# Patient Record
Sex: Male | Born: 1955 | Hispanic: Yes | Marital: Married | Smoking: Former smoker
Health system: Southern US, Community
[De-identification: ages and names within clinical notes are randomized; demographics above are authoritative.]

## PROBLEM LIST (undated history)

## (undated) DIAGNOSIS — Z860100 Personal history of colon polyps, unspecified: Secondary | ICD-10-CM

## (undated) DIAGNOSIS — C801 Malignant (primary) neoplasm, unspecified: Secondary | ICD-10-CM

## (undated) DIAGNOSIS — Z800 Family history of malignant neoplasm of digestive organs: Secondary | ICD-10-CM

## (undated) DIAGNOSIS — Z8051 Family history of malignant neoplasm of kidney: Secondary | ICD-10-CM

## (undated) DIAGNOSIS — Z803 Family history of malignant neoplasm of breast: Secondary | ICD-10-CM

## (undated) DIAGNOSIS — J45909 Unspecified asthma, uncomplicated: Secondary | ICD-10-CM

## (undated) DIAGNOSIS — Z808 Family history of malignant neoplasm of other organs or systems: Secondary | ICD-10-CM

## (undated) DIAGNOSIS — I10 Essential (primary) hypertension: Secondary | ICD-10-CM

## (undated) DIAGNOSIS — M1990 Unspecified osteoarthritis, unspecified site: Secondary | ICD-10-CM

## (undated) HISTORY — DX: Family history of malignant neoplasm of breast: Z80.3

## (undated) HISTORY — DX: Unspecified asthma, uncomplicated: J45.909

## (undated) HISTORY — PX: NECK SURGERY: SHX720

## (undated) HISTORY — DX: Personal history of colonic polyps: Z86.010

## (undated) HISTORY — DX: Family history of malignant neoplasm of kidney: Z80.51

## (undated) HISTORY — DX: Essential (primary) hypertension: I10

## (undated) HISTORY — DX: Personal history of colon polyps, unspecified: Z86.0100

## (undated) HISTORY — DX: Family history of malignant neoplasm of digestive organs: Z80.0

## (undated) HISTORY — PX: CHOLECYSTECTOMY: SHX55

## (undated) HISTORY — PX: LAMINECTOMY: SHX219

## (undated) HISTORY — DX: Family history of malignant neoplasm of other organs or systems: Z80.8

## (undated) HISTORY — PX: BACK SURGERY: SHX140

## (undated) HISTORY — DX: Unspecified osteoarthritis, unspecified site: M19.90

---
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
## 2003-02-26 DIAGNOSIS — I10 Essential (primary) hypertension: Secondary | ICD-10-CM

## 2003-02-26 HISTORY — PX: KNEE SURGERY: SHX244

## 2003-02-26 HISTORY — DX: Essential (primary) hypertension: I10

## 2004-02-26 HISTORY — PX: COLONOSCOPY: SHX174

## 2004-05-08 ENCOUNTER — Ambulatory Visit: Payer: Self-pay | Admitting: Internal Medicine

## 2004-05-25 ENCOUNTER — Ambulatory Visit: Payer: Self-pay | Admitting: Internal Medicine

## 2004-06-11 ENCOUNTER — Ambulatory Visit: Payer: Self-pay | Admitting: Internal Medicine

## 2006-08-11 ENCOUNTER — Ambulatory Visit: Payer: Self-pay

## 2007-04-14 ENCOUNTER — Emergency Department: Payer: Self-pay | Admitting: Emergency Medicine

## 2009-02-25 DIAGNOSIS — M1990 Unspecified osteoarthritis, unspecified site: Secondary | ICD-10-CM

## 2009-02-25 HISTORY — DX: Unspecified osteoarthritis, unspecified site: M19.90

## 2010-12-24 ENCOUNTER — Ambulatory Visit: Payer: Self-pay | Admitting: Internal Medicine

## 2011-10-22 ENCOUNTER — Ambulatory Visit: Payer: Self-pay | Admitting: General Surgery

## 2012-07-18 ENCOUNTER — Emergency Department: Payer: Self-pay | Admitting: Emergency Medicine

## 2012-09-10 ENCOUNTER — Encounter: Payer: Self-pay | Admitting: *Deleted

## 2013-02-10 ENCOUNTER — Emergency Department (HOSPITAL_COMMUNITY)
Admission: EM | Admit: 2013-02-10 | Discharge: 2013-02-10 | Disposition: A | Discharge status: Home / Self Care | Payer: 59 | Attending: Emergency Medicine | Admitting: Emergency Medicine

## 2013-02-10 ENCOUNTER — Encounter (HOSPITAL_COMMUNITY): Payer: Self-pay | Admitting: Emergency Medicine

## 2013-02-10 DIAGNOSIS — R220 Localized swelling, mass and lump, head: Secondary | ICD-10-CM | POA: Insufficient documentation

## 2013-02-10 DIAGNOSIS — I10 Essential (primary) hypertension: Secondary | ICD-10-CM | POA: Insufficient documentation

## 2013-02-10 DIAGNOSIS — J45909 Unspecified asthma, uncomplicated: Secondary | ICD-10-CM | POA: Insufficient documentation

## 2013-02-10 NOTE — ED Notes (Signed)
Still unable to locate pt to update vitals.

## 2013-02-10 NOTE — ED Notes (Signed)
Pt c/o mass to right side of neck that is bigger and more tender over several months; pt sts some issues swallowing at times and so decrease in hearing to right ear

## 2013-02-10 NOTE — ED Notes (Signed)
No answer in waiting room 

## 2013-02-10 NOTE — ED Notes (Signed)
After announcement made and pt updated about wait he got up and walked to restroom.  Unable to locate pt since then.  Pt is no longer in restroom and not answering when called in waiting area.

## 2013-03-03 ENCOUNTER — Ambulatory Visit: Payer: Self-pay | Admitting: Internal Medicine

## 2013-03-04 ENCOUNTER — Other Ambulatory Visit (HOSPITAL_COMMUNITY): Payer: Self-pay | Admitting: Otolaryngology

## 2013-03-04 DIAGNOSIS — R221 Localized swelling, mass and lump, neck: Secondary | ICD-10-CM

## 2013-03-08 ENCOUNTER — Encounter (HOSPITAL_COMMUNITY): Payer: Self-pay

## 2013-03-08 ENCOUNTER — Ambulatory Visit (HOSPITAL_COMMUNITY)
Admission: RE | Admit: 2013-03-08 | Discharge: 2013-03-08 | Disposition: A | Discharge status: Home / Self Care | Payer: 59 | Source: Ambulatory Visit | Attending: Otolaryngology | Admitting: Otolaryngology

## 2013-03-08 DIAGNOSIS — E041 Nontoxic single thyroid nodule: Secondary | ICD-10-CM | POA: Insufficient documentation

## 2013-03-08 DIAGNOSIS — R220 Localized swelling, mass and lump, head: Secondary | ICD-10-CM | POA: Insufficient documentation

## 2013-03-08 DIAGNOSIS — M47812 Spondylosis without myelopathy or radiculopathy, cervical region: Secondary | ICD-10-CM | POA: Insufficient documentation

## 2013-03-08 DIAGNOSIS — R221 Localized swelling, mass and lump, neck: Secondary | ICD-10-CM

## 2013-03-08 MED ORDER — IOHEXOL 300 MG/ML  SOLN
75.0000 mL | Freq: Once | INTRAMUSCULAR | Status: AC | PRN
  Administered 2013-03-08: 75 mL via INTRAVENOUS

## 2013-03-10 ENCOUNTER — Other Ambulatory Visit (HOSPITAL_COMMUNITY): Payer: Self-pay | Admitting: Otolaryngology

## 2013-03-10 DIAGNOSIS — R221 Localized swelling, mass and lump, neck: Secondary | ICD-10-CM

## 2013-03-11 ENCOUNTER — Encounter (HOSPITAL_COMMUNITY): Payer: Self-pay | Admitting: Pharmacy Technician

## 2013-03-11 ENCOUNTER — Other Ambulatory Visit: Payer: Self-pay | Admitting: Radiology

## 2013-03-12 ENCOUNTER — Encounter (HOSPITAL_COMMUNITY): Payer: Self-pay

## 2013-03-12 ENCOUNTER — Ambulatory Visit (HOSPITAL_COMMUNITY)
Admission: RE | Admit: 2013-03-12 | Discharge: 2013-03-12 | Disposition: A | Discharge status: Home / Self Care | Payer: 59 | Source: Ambulatory Visit | Attending: Otolaryngology | Admitting: Otolaryngology

## 2013-03-12 DIAGNOSIS — C770 Secondary and unspecified malignant neoplasm of lymph nodes of head, face and neck: Secondary | ICD-10-CM | POA: Insufficient documentation

## 2013-03-12 DIAGNOSIS — R220 Localized swelling, mass and lump, head: Secondary | ICD-10-CM | POA: Insufficient documentation

## 2013-03-12 DIAGNOSIS — R221 Localized swelling, mass and lump, neck: Secondary | ICD-10-CM

## 2013-03-12 DIAGNOSIS — I10 Essential (primary) hypertension: Secondary | ICD-10-CM | POA: Insufficient documentation

## 2013-03-12 DIAGNOSIS — C801 Malignant (primary) neoplasm, unspecified: Secondary | ICD-10-CM | POA: Insufficient documentation

## 2013-03-12 DIAGNOSIS — Z87891 Personal history of nicotine dependence: Secondary | ICD-10-CM | POA: Insufficient documentation

## 2013-03-12 LAB — PROTIME-INR
INR: 0.85 (ref 0.00–1.49)
PROTHROMBIN TIME: 11.5 s — AB (ref 11.6–15.2)

## 2013-03-12 LAB — CBC
HCT: 44.9 % (ref 39.0–52.0)
Hemoglobin: 16.1 g/dL (ref 13.0–17.0)
MCH: 34.5 pg — AB (ref 26.0–34.0)
MCHC: 35.9 g/dL (ref 30.0–36.0)
MCV: 96.1 fL (ref 78.0–100.0)
PLATELETS: 217 10*3/uL (ref 150–400)
RBC: 4.67 MIL/uL (ref 4.22–5.81)
RDW: 12.1 % (ref 11.5–15.5)
WBC: 4.9 10*3/uL (ref 4.0–10.5)

## 2013-03-12 LAB — APTT: aPTT: 32 seconds (ref 24–37)

## 2013-03-12 MED ORDER — MIDAZOLAM HCL 2 MG/2ML IJ SOLN
INTRAMUSCULAR | Status: AC
  Filled 2013-03-12: qty 4

## 2013-03-12 MED ORDER — SODIUM CHLORIDE 0.9 % IV SOLN: INTRAVENOUS | Status: DC

## 2013-03-12 MED ORDER — MIDAZOLAM HCL 2 MG/2ML IJ SOLN
INTRAMUSCULAR | Status: AC | PRN
  Administered 2013-03-12 (×2): 0.5 mg via INTRAVENOUS
  Administered 2013-03-12: 1 mg via INTRAVENOUS
  Administered 2013-03-12: 0.5 mg via INTRAVENOUS

## 2013-03-12 MED ORDER — FENTANYL CITRATE 0.05 MG/ML IJ SOLN
INTRAMUSCULAR | Status: AC
  Filled 2013-03-12: qty 2

## 2013-03-12 MED ORDER — FENTANYL CITRATE 0.05 MG/ML IJ SOLN
INTRAMUSCULAR | Status: AC | PRN
Start: 2013-03-12 — End: 2013-03-12
  Administered 2013-03-12 (×2): 25 ug via INTRAVENOUS

## 2013-03-12 NOTE — H&P (Signed)
Lance Parsons is an 58 y.o. male.   Chief Complaint: pt noticed swelling "knot" in Rt neck while shaving 11/2012 Knot has increased in size Swelling in area also noted Referred to ENT Dr Simeon Craft US reveals Rt cervical mass Request for biopsy of same  HPI: arthritis; HTN; asthma  Past Medical History  Diagnosis Date  . Arthritis 2011  . Asthma 1967  . Hypertension 2005  . Personal history of colonic polyps     Past Surgical History  Procedure Laterality Date  . Knee surgery Left 2005  . Colonoscopy  2006  . Cholecystectomy      Family History  Problem Relation Age of Onset  . Colon polyps Brother   . Breast cancer Maternal Aunt   . Throat cancer Maternal Uncle   . Colon cancer Father    Social History:  reports that he has quit smoking. He has never used smokeless tobacco. He reports that he drinks alcohol. He reports that he uses illicit drugs (Marijuana).  Allergies:  Allergies  Allergen Reactions  . Codeine Swelling and Rash     (Not in a hospital admission)  Results for orders placed during the hospital encounter of 03/12/13 (from the past 48 hour(s))  APTT     Status: None   Collection Time    03/12/13  9:04 AM      Result Value Range   aPTT 32  24 - 37 seconds  CBC     Status: Abnormal   Collection Time    03/12/13  9:04 AM      Result Value Range   WBC 4.9  4.0 - 10.5 K/uL   RBC 4.67  4.22 - 5.81 MIL/uL   Hemoglobin 16.1  13.0 - 17.0 g/dL   HCT 44.9  39.0 - 52.0 %   MCV 96.1  78.0 - 100.0 fL   MCH 34.5 (*) 26.0 - 34.0 pg   MCHC 35.9  30.0 - 36.0 g/dL   RDW 12.1  11.5 - 15.5 %   Platelets 217  150 - 400 K/uL  PROTIME-INR     Status: Abnormal   Collection Time    03/12/13  9:04 AM      Result Value Range   Prothrombin Time 11.5 (*) 11.6 - 15.2 seconds   INR 0.85  0.00 - 1.49   No results found.  Review of Systems  Constitutional: Negative for fever and weight loss.  Eyes: Negative for blurred vision.  Respiratory: Negative for shortness  of breath.   Cardiovascular: Negative for chest pain.  Gastrointestinal: Negative for nausea, vomiting and abdominal pain.  Musculoskeletal: Positive for neck pain.  Neurological: Negative for dizziness, weakness and headaches.  Psychiatric/Behavioral: Negative for substance abuse.    Blood pressure 141/93, pulse 77, temperature 98 F (36.7 C), temperature source Oral, resp. rate 18, height 5\' 7"  (1.702 m), weight 178 lb (80.74 kg), SpO2 98.00%. Physical Exam  Constitutional: He is oriented to person, place, and time. He appears well-developed and well-nourished.  Cardiovascular: Normal rate, regular rhythm and normal heart sounds.   No murmur heard. Respiratory: Effort normal and breath sounds normal. He has no wheezes.  GI: Soft. Bowel sounds are normal. There is no tenderness.  Musculoskeletal: Normal range of motion.  Neurological: He is alert and oriented to person, place, and time.  Skin: Skin is warm and dry.  Psychiatric: He has a normal mood and affect. His behavior is normal. Judgment and thought content normal.     Assessment/Plan Rt  cervical neck mass Increasing in size since 11/2012 Now scheduled of bx Pt aware of procedure benefits and risks and agreeable to proceed Consent signed and in chart  Mikalia Fessel A 03/12/2013, 9:46 AM

## 2013-03-12 NOTE — Procedures (Signed)
US guided core biopsies of right neck mass/node.  No immediate complication.  4 cores obtained.

## 2013-03-12 NOTE — Discharge Instructions (Signed)
Needle Biopsy °Care After °These instructions give you information on caring for yourself after your procedure. Your doctor may also give you more specific instructions. Call your doctor if you have any problems or questions after your procedure. °HOME CARE °· Rest for 4 hours after your biopsy, except for getting up to go to the bathroom or as told. °· Keep the places where the needles were put in clean and dry. °· Do not put powder or lotion on the sites. °· Do not shower until 24 hours after the test. Remove all bandages (dressings) before showering. °· Remove all bandages at least once every day. Gently clean the sites with soap and water. Keep putting a new bandage on until the skin is closed. °Finding out the results of your test °Ask your doctor when your test results will be ready. Make sure you follow up and get the test results. °GET HELP RIGHT AWAY IF:  °· You have shortness of breath or trouble breathing. °· You have pain or cramping in your belly (abdomen). °· You feel sick to your stomach (nauseous) or throw up (vomit). °· Any of the places where the needles were put in: °· Are puffy (swollen) or red. °· Are sore or hot to the touch. °· Are draining yellowish-white fluid (pus). °· Are bleeding after 10 minutes of pressing down on the site. Have someone keep pressing on any place that is bleeding until you see a doctor. °· You have any unusual pain that will not stop. °· You have a fever. °If you go to the emergency room, tell the nurse that you had a biopsy. Take this paper with you to show the nurse. °MAKE SURE YOU:  °· Understand these instructions. °· Will watch your condition. °· Will get help right away if you are not doing well or get worse. °Document Released: 01/25/2008 Document Revised: 05/06/2011 Document Reviewed: 01/25/2008 °[employer]® Patient Information ©2014 [employer]. ° ° °

## 2013-04-26 DIAGNOSIS — C801 Malignant (primary) neoplasm, unspecified: Secondary | ICD-10-CM

## 2013-04-26 HISTORY — DX: Malignant (primary) neoplasm, unspecified: C80.1

## 2013-04-26 HISTORY — PX: TONSILLECTOMY: SUR1361

## 2013-05-24 ENCOUNTER — Emergency Department (HOSPITAL_COMMUNITY): Payer: 59

## 2013-05-24 ENCOUNTER — Encounter (HOSPITAL_COMMUNITY): Payer: Self-pay | Admitting: Emergency Medicine

## 2013-05-24 ENCOUNTER — Emergency Department (HOSPITAL_COMMUNITY)
Admission: EM | Admit: 2013-05-24 | Discharge: 2013-05-24 | Disposition: A | Discharge status: Home / Self Care | Payer: 59 | Attending: Emergency Medicine | Admitting: Emergency Medicine

## 2013-05-24 DIAGNOSIS — R109 Unspecified abdominal pain: Secondary | ICD-10-CM

## 2013-05-24 DIAGNOSIS — Z87891 Personal history of nicotine dependence: Secondary | ICD-10-CM | POA: Insufficient documentation

## 2013-05-24 DIAGNOSIS — M129 Arthropathy, unspecified: Secondary | ICD-10-CM | POA: Insufficient documentation

## 2013-05-24 DIAGNOSIS — R1031 Right lower quadrant pain: Secondary | ICD-10-CM | POA: Insufficient documentation

## 2013-05-24 DIAGNOSIS — J45909 Unspecified asthma, uncomplicated: Secondary | ICD-10-CM | POA: Insufficient documentation

## 2013-05-24 DIAGNOSIS — Z860100 Personal history of colon polyps, unspecified: Secondary | ICD-10-CM | POA: Insufficient documentation

## 2013-05-24 DIAGNOSIS — I10 Essential (primary) hypertension: Secondary | ICD-10-CM | POA: Insufficient documentation

## 2013-05-24 DIAGNOSIS — Z79899 Other long term (current) drug therapy: Secondary | ICD-10-CM | POA: Insufficient documentation

## 2013-05-24 DIAGNOSIS — Z9089 Acquired absence of other organs: Secondary | ICD-10-CM | POA: Insufficient documentation

## 2013-05-24 LAB — COMPREHENSIVE METABOLIC PANEL
ALK PHOS: 62 U/L (ref 39–117)
ALT: 15 U/L (ref 0–53)
AST: 14 U/L (ref 0–37)
Albumin: 3.2 g/dL — ABNORMAL LOW (ref 3.5–5.2)
BUN: 6 mg/dL (ref 6–23)
CALCIUM: 8.6 mg/dL (ref 8.4–10.5)
CO2: 24 mEq/L (ref 19–32)
Chloride: 106 mEq/L (ref 96–112)
Creatinine, Ser: 0.81 mg/dL (ref 0.50–1.35)
GFR calc Af Amer: >90 mL/min (ref >90)
GFR calc non Af Amer: >90 mL/min (ref >90)
GLUCOSE: 76 mg/dL (ref 70–99)
POTASSIUM: 3.8 meq/L (ref 3.7–5.3)
SODIUM: 143 meq/L (ref 137–147)
TOTAL PROTEIN: 6.4 g/dL (ref 6.0–8.3)
Total Bilirubin: 0.2 mg/dL — ABNORMAL LOW (ref 0.3–1.2)

## 2013-05-24 LAB — CBC WITH DIFFERENTIAL/PLATELET
BASOS ABS: 0 10*3/uL (ref 0.0–0.1)
Basophils Relative: 0 % (ref 0–1)
EOS PCT: 2 % (ref 0–5)
Eosinophils Absolute: 0.2 10*3/uL (ref 0.0–0.7)
HCT: 37.9 % — ABNORMAL LOW (ref 39.0–52.0)
Hemoglobin: 13.4 g/dL (ref 13.0–17.0)
LYMPHS ABS: 1.7 10*3/uL (ref 0.7–4.0)
Lymphocytes Relative: 27 % (ref 12–46)
MCH: 33.6 pg (ref 26.0–34.0)
MCHC: 35.4 g/dL (ref 30.0–36.0)
MCV: 95 fL (ref 78.0–100.0)
Monocytes Absolute: 0.7 10*3/uL (ref 0.1–1.0)
Monocytes Relative: 11 % (ref 3–12)
NEUTROS PCT: 60 % (ref 43–77)
Neutro Abs: 3.7 10*3/uL (ref 1.7–7.7)
PLATELETS: 194 10*3/uL (ref 150–400)
RBC: 3.99 MIL/uL — AB (ref 4.22–5.81)
RDW: 12.2 % (ref 11.5–15.5)
WBC: 6.2 10*3/uL (ref 4.0–10.5)

## 2013-05-24 LAB — URINALYSIS, ROUTINE W REFLEX MICROSCOPIC
Bilirubin Urine: NEGATIVE
Glucose, UA: NEGATIVE mg/dL
HGB URINE DIPSTICK: NEGATIVE
Ketones, ur: NEGATIVE mg/dL
Leukocytes, UA: NEGATIVE
Nitrite: NEGATIVE
Protein, ur: NEGATIVE mg/dL
SPECIFIC GRAVITY, URINE: 1.007 (ref 1.005–1.030)
UROBILINOGEN UA: 0.2 mg/dL (ref 0.0–1.0)
pH: 6.5 (ref 5.0–8.0)

## 2013-05-24 LAB — LIPASE, BLOOD: Lipase: 17 U/L (ref 11–59)

## 2013-05-24 MED ORDER — SODIUM CHLORIDE 0.9 % IV BOLUS (SEPSIS)
1000.0000 mL | Freq: Once | INTRAVENOUS | Status: AC
  Administered 2013-05-24: 1000 mL via INTRAVENOUS

## 2013-05-24 MED ORDER — HYDROMORPHONE HCL PF 1 MG/ML IJ SOLN
0.5000 mg | Freq: Once | INTRAMUSCULAR | Status: AC
Start: 2013-05-24 — End: 2013-05-24
  Administered 2013-05-24: 0.5 mg via INTRAVENOUS
  Filled 2013-05-24: qty 1

## 2013-05-24 MED ORDER — HYDROMORPHONE HCL PF 1 MG/ML IJ SOLN
1.0000 mg | Freq: Once | INTRAMUSCULAR | Status: DC
Start: 2013-05-24 — End: 2013-05-24

## 2013-05-24 MED ORDER — IOHEXOL 300 MG/ML  SOLN
25.0000 mL | INTRAMUSCULAR | Status: AC
  Administered 2013-05-24: 25 mL via ORAL

## 2013-05-24 MED ORDER — IOHEXOL 300 MG/ML  SOLN
80.0000 mL | Freq: Once | INTRAMUSCULAR | Status: AC | PRN
  Administered 2013-05-24: 80 mL via INTRAVENOUS

## 2013-05-24 NOTE — ED Notes (Signed)
Pt states that he started having RLQ pain on Thursday, flatus, constipation, LBM 2 days ago.  Pt has completed CT Scan.  Continuing IVF bolus as ordered

## 2013-05-24 NOTE — ED Notes (Signed)
Pt reports that he developed rlq pain on Friday. States that he recently had major cancer surgery on March 2nd to remove lymph nodes in his neck and has been taking pain medication since then. States that he has had trouble with bowel movements for the past couple of days. Denies any n/v, but reports constipation.

## 2013-05-24 NOTE — ED Notes (Signed)
Provider at the bedside.  

## 2013-05-24 NOTE — ED Provider Notes (Signed)
CSN: [id number]     Arrival date & time 05/24/13  0729 History   First MD Initiated Contact with Patient 05/24/13 0730     Chief Complaint  Patient presents with  . Abdominal Pain     (Consider location/radiation/quality/duration/timing/severity/associated sxs/prior Treatment) The history is provided by the patient. No language interpreter was used.  Lance Parsons is a 58 y/o M with PMHX of arthritis, HTN, asthma, colonic polyps, recent surgery on 04/26/2013 regarding removal of tonsils, 60 lymph nodes, and adenoids as per patient's report by Dr. Nanine Means at [facility] regarding possible occult tumor that was not found - suspicion to be cancer - presenting to the ED with abdominal pain. Reported that the abdominal pain started on Friday evening localized to the RLQ described at first as a dull aching sensation that has gotten progressively worse throughout the weekend and day. Patient reported that the pain is a sharp shooting pain with coughing and motion. Patient reported that he has not had a BM within 3 days. Reported that he is on Oxycodone for the discomfort of the neck surgery and reported that he is currently taking a stool softener, Senna. Reported that he has been passing gas. Denied nausea, vomiting, diarrhea, chest pain, shortness of breath, difficulty breathing, dizziness, weakness, melena, hematochezia, penile swelling, testicular swelling, testicular pain. PCP Dr. Hall Busing  Past Medical History  Diagnosis Date  . Arthritis 2011  . Asthma 1967  . Hypertension 2005  . Personal history of colonic polyps    Past Surgical History  Procedure Laterality Date  . Knee surgery Left 2005  . Colonoscopy  2006  . Cholecystectomy     Family History  Problem Relation Age of Onset  . Colon polyps Brother   . Breast cancer Maternal Aunt   . Throat cancer Maternal Uncle   . Colon cancer Father    History  Substance Use Topics  . Smoking status: Former Smoker -- 1.00 packs/day for 7 years   . Smokeless tobacco: Never Used  . Alcohol Use: Yes    Review of Systems  Constitutional: Negative for fever and chills.  Respiratory: Negative for chest tightness and shortness of breath.   Cardiovascular: Negative for chest pain.  Gastrointestinal: Positive for abdominal pain. Negative for nausea, vomiting, diarrhea, constipation, blood in stool and anal bleeding.  Musculoskeletal: Negative for back pain.  Neurological: Negative for dizziness and weakness.  All other systems reviewed and are negative.      Allergies  Codeine  Home Medications   Current Outpatient Rx  Name  Route  Sig  Dispense  Refill  . albuterol (PROVENTIL HFA;VENTOLIN HFA) 108 (90 BASE) MCG/ACT inhaler   Inhalation   Inhale 1 puff into the lungs every 6 (six) hours as needed for wheezing or shortness of breath.         . ALPRAZolam (XANAX) 1 MG tablet   Oral   Take 1 mg by mouth 3 (three) times daily as needed for anxiety or sleep.          Marland Kitchen lisinopril (PRINIVIL,ZESTRIL) 10 MG tablet   Oral   Take 10 mg by mouth daily.         Marland Kitchen oxyCODONE (ROXICODONE) 5 MG/5ML solution   Oral   Take 10 mg by mouth every 4 (four) hours as needed. For pain         . senna (SENOKOT) 8.6 MG tablet   Oral   Take 1 tablet by mouth at bedtime.  BP 138/96  Pulse 86  Temp(Src) 98.7 F (37.1 C)  Resp 18  Ht 5\' 7"  (1.702 m)  Wt 169 lb (76.658 kg)  BMI 26.46 kg/m2  SpO2 99% Physical Exam  Nursing note and vitals reviewed. Constitutional: He is oriented to person, place, and time. He appears well-developed and well-nourished. No distress.  HENT:  Head: Normocephalic and atraumatic.  Mouth/Throat: Oropharynx is clear and moist. No oropharyngeal exudate.  Eyes: Conjunctivae and EOM are normal. Pupils are equal, round, and reactive to light. Right eye exhibits no discharge. Left eye exhibits no discharge.  Neck: Normal range of motion. Neck supple. No tracheal deviation present.  Cardiovascular:  Normal rate, regular rhythm and normal heart sounds.  Exam reveals no friction rub.   No murmur heard. Pulmonary/Chest: Effort normal and breath sounds normal. No respiratory distress. He has no wheezes. He has no rales.  Abdominal: Soft. Bowel sounds are normal. There is tenderness. There is no guarding.  Negative abdominal distention Discomfort upon palpation to the right lower quadrant, positive McBurney's point Scar identified to the abdomen where cholecystectomy was performed.  Musculoskeletal: Normal range of motion.  Full ROM to upper and lower extremities without difficulty noted, negative ataxia noted.  Neurological: He is alert and oriented to person, place, and time. No cranial nerve deficit. He exhibits normal muscle tone. Coordination normal.  Skin: Skin is warm and dry. No rash noted. He is not diaphoretic. No erythema.  Psychiatric: He has a normal mood and affect. His behavior is normal. Thought content normal.    ED Course  Procedures (including critical care time)  12:54 PM This provider spoke with the patient regarding labs and imaging. Patient reported that he has always been told that he has scarring in his right lobe. Discussed pain medications here, fluid challenge, and discharge. Patient agreed to plan of care and understood. Reported that he is able to take Dilaudid - reported that he can take morphine with zofran.   Results for orders placed during the hospital encounter of 05/24/13  CBC WITH DIFFERENTIAL      Result Value Ref Range   WBC 6.2  4.0 - 10.5 K/uL   RBC 3.99 (*) 4.22 - 5.81 MIL/uL   Hemoglobin 13.4  13.0 - 17.0 g/dL   HCT 37.9 (*) 39.0 - 52.0 %   MCV 95.0  78.0 - 100.0 fL   MCH 33.6  26.0 - 34.0 pg   MCHC 35.4  30.0 - 36.0 g/dL   RDW 12.2  11.5 - 15.5 %   Platelets 194  150 - 400 K/uL   Neutrophils Relative % 60  43 - 77 %   Neutro Abs 3.7  1.7 - 7.7 K/uL   Lymphocytes Relative 27  12 - 46 %   Lymphs Abs 1.7  0.7 - 4.0 K/uL   Monocytes Relative  11  3 - 12 %   Monocytes Absolute 0.7  0.1 - 1.0 K/uL   Eosinophils Relative 2  0 - 5 %   Eosinophils Absolute 0.2  0.0 - 0.7 K/uL   Basophils Relative 0  0 - 1 %   Basophils Absolute 0.0  0.0 - 0.1 K/uL  COMPREHENSIVE METABOLIC PANEL      Result Value Ref Range   Sodium 143  137 - 147 mEq/L   Potassium 3.8  3.7 - 5.3 mEq/L   Chloride 106  96 - 112 mEq/L   CO2 24  19 - 32 mEq/L   Glucose, Bld 76  70 - 99 mg/dL   BUN 6  6 - 23 mg/dL   Creatinine, Ser 0.81  0.50 - 1.35 mg/dL   Calcium 8.6  8.4 - 10.5 mg/dL   Total Protein 6.4  6.0 - 8.3 g/dL   Albumin 3.2 (*) 3.5 - 5.2 g/dL   AST 14  0 - 37 U/L   ALT 15  0 - 53 U/L   Alkaline Phosphatase 62  39 - 117 U/L   Total Bilirubin 0.2 (*) 0.3 - 1.2 mg/dL   GFR calc non Af Amer >90  >90 mL/min   GFR calc Af Amer >90  >90 mL/min  LIPASE, BLOOD      Result Value Ref Range   Lipase 17  11 - 59 U/L  URINALYSIS, ROUTINE W REFLEX MICROSCOPIC      Result Value Ref Range   Color, Urine YELLOW  YELLOW   APPearance CLEAR  CLEAR   Specific Gravity, Urine 1.007  1.005 - 1.030   pH 6.5  5.0 - 8.0   Glucose, UA NEGATIVE  NEGATIVE mg/dL   Hgb urine dipstick NEGATIVE  NEGATIVE   Bilirubin Urine NEGATIVE  NEGATIVE   Ketones, ur NEGATIVE  NEGATIVE mg/dL   Protein, ur NEGATIVE  NEGATIVE mg/dL   Urobilinogen, UA 0.2  0.0 - 1.0 mg/dL   Nitrite NEGATIVE  NEGATIVE   Leukocytes, UA NEGATIVE  NEGATIVE    Labs Review Labs Reviewed  CBC WITH DIFFERENTIAL - Abnormal; Notable for the following:    RBC 3.99 (*)    HCT 37.9 (*)    All other components within normal limits  COMPREHENSIVE METABOLIC PANEL - Abnormal; Notable for the following:    Albumin 3.2 (*)    Total Bilirubin 0.2 (*)    All other components within normal limits  LIPASE, BLOOD  URINALYSIS, ROUTINE W REFLEX MICROSCOPIC   Imaging Review Ct Abdomen Pelvis W Contrast  05/24/2013   CLINICAL DATA:  Right lower quadrant pain.  EXAM: CT ABDOMEN AND PELVIS WITH CONTRAST  TECHNIQUE:  Multidetector CT imaging of the abdomen and pelvis was performed using the standard protocol following bolus administration of intravenous contrast.  CONTRAST:  44mL OMNIPAQUE IOHEXOL 300 MG/ML  SOLN  COMPARISON:  CT ABDOMEN W/ CM dated 05/25/2004  FINDINGS: Subsegmental atelectasis is seen in the right middle lobe and both lower lobes. There is an area of focal airspace opacity in the posterior medial right base which is likely also atelectatic, but pneumonia cannot be entirely excluded by imaging. .  No focal abnormality is seen in the liver or spleen. The stomach, duodenum, pancreas, and adrenal glands are unremarkable. The gallbladder is surgically absent. Central sinus cyst in the right kidney measures 3.0 cm. This was present previously and measured 2.5 cm. Small cortical cysts in the lower pole the right kidney are noted. There is a tiny low-density lesion in the interpolar left kidney, also likely a cyst.  Insert athero aorta no free fluid or lymphadenopathy in the abdomen.  Imaging through the pelvis shows no free intraperitoneal fluid. There is no pelvic sidewall lymphadenopathy. The bladder is normal in appearance. Prostate gland is mildly enlarged. Mild scattered diverticular changes seen in the left colon without evidence for diverticulitis. The terminal ileum is normal. The appendix is normal.  Bone windows reveal no worrisome lytic or sclerotic osseous lesions.  IMPRESSION: Subsegmental atelectasis in the lung bases with a more focal area airspace consolidation in the posterior medial right base which is also likely atelectatic but  pneumonia cannot be excluded.  No findings to explain the patient's history of right lower quadrant pain.   Electronically Signed   By: Misty Stanley M.D.   On: 05/24/2013 12:00     EKG Interpretation None      MDM   Final diagnoses:  Abdominal pain   Medications  iohexol (OMNIPAQUE) 300 MG/ML solution 25 mL (25 mLs Oral Contrast Given 05/24/13 1020)  sodium  chloride 0.9 % bolus 1,000 mL (0 mLs Intravenous Stopped 05/24/13 1307)  iohexol (OMNIPAQUE) 300 MG/ML solution 80 mL (80 mLs Intravenous Contrast Given 05/24/13 1147)  sodium chloride 0.9 % bolus 1,000 mL (1,000 mLs Intravenous New Bag/Given 05/24/13 1308)  HYDROmorphone (DILAUDID) injection 0.5 mg (0.5 mg Intravenous Given 05/24/13 1309)   Filed Vitals:   05/24/13 1013 05/24/13 1225 05/24/13 1230 05/24/13 1315  BP: 131/95 140/89 139/85 138/96  Pulse: 79 85 80 86  Temp:      Resp: 18 18    Height:      Weight:      SpO2: 100% 97% 97% 99%    Patient presenting to the ED with abdominal pain localized to right lower quadrant described as a dull aching sensation this progressively got worse since Friday. Reported sharp shooting pain localized to right lower quadrant with motion and coughing. Stated that he has not had a bowel movement in the past 3 days-reported that since surgery taking oxycodone is been having issues with bowel movements is currently taking stool softeners to aid in bowel movement relief. Stated that he has been passing flatulence. Patient recently had surgery in 04/26/2013 performed by Dr. Nanine Means at [facility] regarding lymph node removal, approximately 60 as per patient's poor, bilateral tonsillectomy as well as adenoidectomy. 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 Reported that the occult tumor could not be found. Has appointment with oncologist on 06/02/2013 regarding followup. Alert and oriented. GCS 15. Heart rate and rhythm normal. Lungs clear to auscultation to upper and lower lobes bilaterally. Radial pulses 2+ bilaterally. BS normoactive in all 4 quadrants - soft upon palpation. Mild discomfort upon palpation to the RLQ - positive McBurney's point. Scar noted where cholecystectomy was performed. CBC negative elevation white blood cell count identified-negative left shift or leukocytosis noted. CMP negative findings for abnormalities in liver  and kidney functioning. Lipase negative elevation. Urine negative for infection-negative nitrites, hemoglobin, leukocytes identified. CT abdomen and pelvis negative for acute abdominal processes-of subsegmental atelectasis in the lung bases a more focal area airspace consolidation the posterior medial right base which is also likely atelectasis but pneumonia cannot be excluded. No acute findings noted to right lower quadrant discomfort. Negative findings of appendicitis. Negative findings of colitis. Patient presenting with incidental finding of possible pneumonia to the right lobe - as per patient reported that he has had this finding for many years. Doubt pneumonia secondary to negative respiratory distress, afebrile, negative elevated white blood cell count, no cough symptoms, negative hypoxia. Etiology of abdominal pain unknown. Cannot rule out possible hernia. Patient stable, afebrile. Patient does not appear septic. Pain controlled in ED setting. Discharged patient. Referred patient to primary care provider. Discussed with patient to keep appointment with ENT on 06/02/2013. Referred to gastroenterology. Discussed with patient proper diet. Discussed with patient to rest and stay hydrated. Discussed with patient to closely monitor symptoms and if symptoms are to worsen or change to report back to the ED - strict return instructions given.  Patient agreed to plan of care, understood,  all questions answered.   Jamse Mead, PA-C 05/24/13 1758

## 2013-05-24 NOTE — ED Notes (Signed)
Discharge with wife as driver.

## 2013-05-24 NOTE — Discharge Instructions (Signed)
Please call your doctor for a followup appointment within 24-48 hours. When you talk to your doctor please let them know that you were seen in the emergency department and have them acquire all of your records so that they can discuss the findings with you and formulate a treatment plan to fully care for your new and ongoing problems. Please call and set-up an appointment with your primary care provider to be seen and re-assessed Please rest and stay hydrated Please avoid any physical or strenuous activity Please continue to take at home medications as prescribed Please continue to monitor symptoms closely if symptoms are to worsen or change (fever greater than 101, chills, chest pain, shortness of breath, difficulty breathing, worsening or changes to abdominal pain, nausea, vomiting, blood in the stools, black tarry stools) please report back to the ED immediately   Abdominal Pain, Adult Many things can cause abdominal pain. Usually, abdominal pain is not caused by a disease and will improve without treatment. It can often be observed and treated at home. Your health care provider will do a physical exam and possibly order blood tests and X-rays to help determine the seriousness of your pain. However, in many cases, more time must pass before a clear cause of the pain can be found. Before that point, your health care provider may not know if you need more testing or further treatment. HOME CARE INSTRUCTIONS  Monitor your abdominal pain for any changes. The following actions may help to alleviate any discomfort you are experiencing:  Only take over-the-counter or prescription medicines as directed by your health care provider.  Do not take laxatives unless directed to do so by your health care provider.  Try a clear liquid diet (broth, tea, or water) as directed by your health care provider. Slowly move to a bland diet as tolerated. SEEK MEDICAL CARE IF:  You have unexplained abdominal  pain.  You have abdominal pain associated with nausea or diarrhea.  You have pain when you urinate or have a bowel movement.  You experience abdominal pain that wakes you in the night.  You have abdominal pain that is worsened or improved by eating food.  You have abdominal pain that is worsened with eating fatty foods. SEEK IMMEDIATE MEDICAL CARE IF:   Your pain does not go away within 2 hours.  You have a fever.  You keep throwing up (vomiting).  Your pain is felt only in portions of the abdomen, such as the right side or the left lower portion of the abdomen.  You pass bloody or black tarry stools. MAKE SURE YOU:  Understand these instructions.   Will watch your condition.   Will get help right away if you are not doing well or get worse.  Document Released: 11/21/2004 Document Revised: 12/02/2012 Document Reviewed: 10/21/2012 [facility] Patient Information 2014 [place].   Emergency Department Resource Guide 1) Find a Doctor and Pay Out of Pocket Although you won't have to find out who is covered by your insurance plan, it is a good idea to ask around and get recommendations. You will then need to call the office and see if the doctor you have chosen will accept you as a new patient and what types of options they offer for patients who are self-pay. Some doctors offer discounts or will set up payment plans for their patients who do not have insurance, but you will need to ask so you aren't surprised when you get to your appointment.  2) Contact  Your Local Health Department Not all health departments have doctors that can see patients for sick visits, but many do, so it is worth a call to see if yours does. If you don't know where your local health department is, you can check in your phone book. The CDC also has a tool to help you locate your state's health department, and many state websites also have listings of all of their local health departments.  3) Find a  [facility] If your illness is not likely to be very severe or complicated, you may want to try a walk in clinic. These are popping up all over the country in pharmacies, drugstores, and shopping centers. They're usually staffed by nurse practitioners or physician assistants that have been trained to treat common illnesses and complaints. They're usually fairly quick and inexpensive. However, if you have serious medical issues or chronic medical problems, these are probably not your best option.  No Primary Care Doctor: - Call Health Connect at  [phone] - they can help you locate a primary care doctor that  accepts your insurance, provides certain services, etc. - Physician Referral Service- [phone]  Chronic Pain Problems: Organization         Address  Phone   Notes  [facility]  [phone] Patients need to be referred by their primary care doctor.   Medication Assistance: Organization         Address  Phone   Notes  [facility] Medication [facility] [address] [place], [place] [address] [phone] --Must be a resident of [facility] -- Must have NO insurance coverage whatsoever (no Medicaid/ Medicare, etc.) -- The pt. MUST have a primary care doctor that directs their care regularly and follows them in the community   MedAssist  [phone]   [employer]  [phone]    Agencies that provide inexpensive medical care: Organization         Address  Phone   Notes  Secretary  [phone]   [facility]    [phone]   [facility] [place], [place] [address] [phone]   [place] [address], [place] [phone]   Planned Parenthood    [phone]   [facility]    [phone]   [place] and [address], [place] Phone:  [phone], Fax:  [phone] Hours of Operation:  9 am - 6 pm, M-F.  Also accepts Medicaid/Medicare and self-pay.  [facility] for [place], [address], [place] Phone: [phone], Fax: [phone]. Hours of Operation:  8:30 am - 5:30 pm, M-F.  Also accepts Medicaid and self-pay.  [facility] [place] [address] [place] Phone: [phone]   [place], [place], [place] [phone], Ext. [phone] Mondays & Thursdays: 7-9 AM.  First 15 patients are seen on a first come, first serve basis.    [place] Providers:  Organization         Address  Phone   Notes  [facility] [address], [place] [phone] Also accepts self-pay patients.  [place], [place]  [phone]   [place], [address], [place] (  [phone]   [facility] [address], [place] [phone]   Lucianne Lei [address] [place] [address], [place]   [phone] Only accepts [place] Access [place] patients after they have their name applied to their card.   Self-Pay (no insurance) in [facility]:  Organization         Address  Phone   Notes  Sickle Cell Patients, [facility] [place] [phone]   [facility] [place] [phone]   [facility] [place]  [place], [place], [place] [phone]   [facility]/Dr. Osei-Bonsu  [address] [place] or [address], [place] [phone] Phone number for both [place] and [place] locations is the same.  Urgent Medical and [facility] [address] [place] [phone]   [facility] [address] [place] or [address] [phone]    [facility] [address] [place] [phone], phone; [phone], fax Sees patients 1st and 3rd Saturday of every month.  Must not qualify for public or private insurance (i.e. Medicaid, Medicare, [place] Health Choice, Veterans' Benefits)  Household income should be no more than 200% of the poverty level The clinic cannot treat you if you are pregnant or think you are pregnant  Sexually transmitted diseases are not treated at the clinic.    Dental Care: Organization         Address  Phone  Notes  [facility] of [facility] [place] [phone] Accepts children up to age 49 who are enrolled in [place] or [place]; pregnant women with a Medicaid card; and children who have applied for Medicaid or [place] Health Choice, but were declined, whose parents can pay a reduced fee at time of service.  [facility] Department of [facility]  [address], [place] [phone] Accepts children up to age 68 who are enrolled in [place] or [place]; pregnant women with a Medicaid card; and children who have applied for Medicaid or [place] Health Choice, but were declined, whose parents can pay a reduced fee at time of service.  [place] Adult Dental Access PROGRAM  [place] [phone] Patients are seen by appointment only. Walk-ins are not accepted. [place] will see patients 72 years of age and older. Monday - Tuesday (8am-5pm) Most Wednesdays (8:30-5pm) $30 per visit, cash only  [facility] Adult Dental Access PROGRAM  [address], [facility] [phone] Patients are seen by appointment only. Walk-ins are not accepted. [place] will see patients 78 years of age and older. One Wednesday Evening (Monthly: Volunteer Based).  $30 per visit, cash only  [place]  [phone] for adults; Children under age 57, call  Graduate Pediatric Dentistry at [phone]. Children aged 41-14, please call [phone] to request a pediatric application.  Dental services are provided in all areas of dental care including fillings, crowns and bridges, complete and partial dentures, implants, gum treatment, root canals, and extractions. Preventive care is also provided. Treatment is provided to both adults and children. Patients are selected via a lottery and there is often a waiting list.   [facility] [address], [place]  [phone] [email or web address]   [facility] [address] [place], [place] [phone], Ext. [phone] Second and Fourth Thursday of each month, opens at 6:30 AM; Clinic ends at 9 AM.  Patients are seen on a first-come first-served basis, and a limited number are seen during each clinic.   [facility]  [address] Hillard Danker [place], [place] [phone]   Eligibility Requirements You must have lived in [place], [place], or [place] counties for at least the last three months.   You cannot be eligible for state or federal sponsored [employer], including [employer], [place], or [employer].   You generally cannot be eligible for healthcare insurance through your employer.    How to apply: Eligibility screenings are held every Tuesday and Wednesday afternoon from 1:00 pm until 4:00 pm. You do not need an appointment for the interview!  [facility] [address], [place], [place]   [place]  [place] Department  [place]  [phone]    Behavioral Health Resources in the Community: Intensive Outpatient Programs Organization         Address  Phone  Notes  [place]. [address] [place], [place] [phone]   [facility] [address], [place], [place]   ADS: Alcohol [facility] Drug S[facility] [address] [address], [place], [place]   [place] [address]  [place], [place] or [phone]   Substance Abuse Resources Organization         Address  Phone  Notes  Alcohol and Drug Services  [phone]   [place]  [phone]   [place]   Chinita Pester  [phone]   Residential & Outpatient Substance Abuse Program  [phone]   Psychological Services Organization         Address  Phone  Notes  [facility] [place]  [place]  [phone]   [place] [address], [place] or [phone]    Mobile Crisis Teams Organization         Address  Phone  Notes  [facility], Mobile Crisis Care Unit  [phone]   [facility]  [address] [place], [place]   [place] Levels [address], [place] [phone]    Self-Help/Support Groups Organization         Address  Phone             Notes  [place]. of [place] - variety of support groups  [place] Call for more information  Narcotics Anonymous (NA), [facility] [address], [employer] [place]  2 meetings at this location   Special educational needs teacher         Address  Phone  Notes  [facility]idential [facility] [place],    [place]  [phone]   [facility]  [address], [place] [address], [place], [place]   [place], [place] [phone] Admissions: 8am-3pm M-F  Incentives Substance [place] [address]    [place], [place] [phone]   [facility] [address] Jadene Pierini [place], [place]   [facility] [address]  [place], [place]   [facility] Programs -  Intensive Outpatient [address], [place], [place] [phone]   [facility] ([place] [place].,  [place], [place] [phone] or [phone]   [facility] (RTS) [address], [place], [place] Accepts Medicaid  [facility] [place] [address],  [place] [phone] Substance Abuse/Addiction Treatment   [facility] Organization         Address  Phone  Notes  [facility]  [phone]   Domenic Schwab, PhD [address] Arlis [place], [place]   [phone] or [phone]   [place], [place] [phone]   [place] [address], [place], [place] [phone] Insurance/Medicaid/sponsorship through [facility] and Families [address]                                    [place], [place] [phone] [place] [address], [place] [phone]    Dr. Adele Schilder  [phone]   [facility] Dept. 1) [address] [place] 2) [place] 3)  [place] [address], [place] [phone]  [phone]   [place] [phone] or [phone] (After Hours)

## 2013-05-25 NOTE — ED Provider Notes (Signed)
Medical screening examination/treatment/procedure(s) were performed by non-physician practitioner and as supervising physician I was immediately available for consultation/collaboration.   EKG Interpretation None        Zakaria Fromer T Minnie Legros, MD 05/25/13 1540 

## 2014-10-16 ENCOUNTER — Encounter (HOSPITAL_COMMUNITY): Payer: Self-pay

## 2014-10-16 ENCOUNTER — Emergency Department (HOSPITAL_COMMUNITY)
Admission: EM | Admit: 2014-10-16 | Discharge: 2014-10-17 | Disposition: A | Discharge status: Home / Self Care | Payer: 59 | Attending: Emergency Medicine | Admitting: Emergency Medicine

## 2014-10-16 ENCOUNTER — Emergency Department (HOSPITAL_COMMUNITY): Payer: 59

## 2014-10-16 DIAGNOSIS — Z87891 Personal history of nicotine dependence: Secondary | ICD-10-CM | POA: Diagnosis not present

## 2014-10-16 DIAGNOSIS — R6883 Chills (without fever): Secondary | ICD-10-CM | POA: Insufficient documentation

## 2014-10-16 DIAGNOSIS — Z79899 Other long term (current) drug therapy: Secondary | ICD-10-CM | POA: Diagnosis not present

## 2014-10-16 DIAGNOSIS — J45909 Unspecified asthma, uncomplicated: Secondary | ICD-10-CM | POA: Insufficient documentation

## 2014-10-16 DIAGNOSIS — Z86010 Personal history of colonic polyps: Secondary | ICD-10-CM | POA: Diagnosis not present

## 2014-10-16 DIAGNOSIS — R42 Dizziness and giddiness: Secondary | ICD-10-CM | POA: Diagnosis not present

## 2014-10-16 DIAGNOSIS — M1990 Unspecified osteoarthritis, unspecified site: Secondary | ICD-10-CM | POA: Diagnosis not present

## 2014-10-16 DIAGNOSIS — M542 Cervicalgia: Secondary | ICD-10-CM | POA: Insufficient documentation

## 2014-10-16 DIAGNOSIS — Z8589 Personal history of malignant neoplasm of other organs and systems: Secondary | ICD-10-CM | POA: Diagnosis not present

## 2014-10-16 DIAGNOSIS — G4700 Insomnia, unspecified: Secondary | ICD-10-CM

## 2014-10-16 DIAGNOSIS — R079 Chest pain, unspecified: Secondary | ICD-10-CM | POA: Insufficient documentation

## 2014-10-16 DIAGNOSIS — I10 Essential (primary) hypertension: Secondary | ICD-10-CM | POA: Insufficient documentation

## 2014-10-16 HISTORY — DX: Malignant (primary) neoplasm, unspecified: C80.1

## 2014-10-16 LAB — URINALYSIS, ROUTINE W REFLEX MICROSCOPIC
Bilirubin Urine: NEGATIVE
Glucose, UA: NEGATIVE mg/dL
Ketones, ur: NEGATIVE mg/dL
LEUKOCYTES UA: NEGATIVE
NITRITE: NEGATIVE
Protein, ur: NEGATIVE mg/dL
SPECIFIC GRAVITY, URINE: 1.009 (ref 1.005–1.030)
UROBILINOGEN UA: 0.2 mg/dL (ref 0.0–1.0)
pH: 5.5 (ref 5.0–8.0)

## 2014-10-16 LAB — COMPREHENSIVE METABOLIC PANEL
ALBUMIN: 4.2 g/dL (ref 3.5–5.0)
ALT: 15 U/L — ABNORMAL LOW (ref 17–63)
ANION GAP: 10 (ref 5–15)
AST: 17 U/L (ref 15–41)
Alkaline Phosphatase: 57 U/L (ref 38–126)
BUN: 15 mg/dL (ref 6–20)
CHLORIDE: 103 mmol/L (ref 101–111)
CO2: 23 mmol/L (ref 22–32)
Calcium: 8.9 mg/dL (ref 8.9–10.3)
Creatinine, Ser: 1.09 mg/dL (ref 0.61–1.24)
GFR calc Af Amer: >60 mL/min (ref >60)
GFR calc non Af Amer: >60 mL/min (ref >60)
Glucose, Bld: 106 mg/dL — ABNORMAL HIGH (ref 65–99)
POTASSIUM: 3.6 mmol/L (ref 3.5–5.1)
Sodium: 136 mmol/L (ref 135–145)
Total Bilirubin: 0.7 mg/dL (ref 0.3–1.2)
Total Protein: 7.1 g/dL (ref 6.5–8.1)

## 2014-10-16 LAB — CBC
HEMATOCRIT: 44.7 % (ref 39.0–52.0)
HEMOGLOBIN: 15.6 g/dL (ref 13.0–17.0)
MCH: 33.5 pg (ref 26.0–34.0)
MCHC: 34.9 g/dL (ref 30.0–36.0)
MCV: 95.9 fL (ref 78.0–100.0)
Platelets: 271 10*3/uL (ref 150–400)
RBC: 4.66 MIL/uL (ref 4.22–5.81)
RDW: 12.3 % (ref 11.5–15.5)
WBC: 10.1 10*3/uL (ref 4.0–10.5)

## 2014-10-16 LAB — URINE MICROSCOPIC-ADD ON

## 2014-10-16 LAB — LIPASE, BLOOD: LIPASE: 22 U/L (ref 22–51)

## 2014-10-16 MED ORDER — SODIUM CHLORIDE 0.9 % IV BOLUS (SEPSIS)
1000.0000 mL | Freq: Once | INTRAVENOUS | Status: AC
  Administered 2014-10-16: 1000 mL via INTRAVENOUS

## 2014-10-16 MED ORDER — IOHEXOL 300 MG/ML  SOLN
75.0000 mL | Freq: Once | INTRAMUSCULAR | Status: AC | PRN
  Administered 2014-10-16: 75 mL via INTRAVENOUS

## 2014-10-16 NOTE — ED Notes (Signed)
Pt reports onset 1 week not sleeping, chills, tingling in fingertips, ligihtheaded when standing or moving, right sided neck pain and abd pain.  Pt reports he just hasn't felt good in the past week.

## 2014-10-17 MED ORDER — ZOLPIDEM TARTRATE 5 MG PO TABS: 5.0000 mg | ORAL_TABLET | Freq: Every evening | ORAL | Status: DC | PRN

## 2014-10-17 MED ORDER — SODIUM CHLORIDE 0.9 % IV BOLUS (SEPSIS): 1000.0000 mL | Freq: Once | INTRAVENOUS | Status: DC

## 2014-10-17 NOTE — Discharge Instructions (Signed)
Return here as needed.  Follow-up with your primary care doctor.  The testing here today did not show any significant abnormalities.  Increase your fluid intake

## 2014-10-17 NOTE — ED Provider Notes (Signed)
CSN: [id number]     Arrival date & time 10/16/14  1804 History   First MD Initiated Contact with Patient 10/16/14 2047     Chief Complaint  Patient presents with  . Dizziness  . Insomnia  . Abdominal Pain     (Consider location/radiation/quality/duration/timing/severity/associated sxs/prior Treatment) HPI Patient presents to the emergency department with one week of not sleeping, chills, lightheaded, right-sided neck pain and right rib pain.  Patient states that he had a previous history of cancer of the neck.  He feels swollen in his neck.  The patient denies chest pain, shortness of breath, nausea, vomiting, weakness, dizziness, headache, blurred vision, back pain, numbness, lightheadedness, near syncope or syncope.  Patient states he took diphenhydramine for his insomnia.  The patient states that he was worried about his son possibly having cancer and then he got in intra-articular joint injection of cortisone and that insomnia continued through the weekend. Past Medical History  Diagnosis Date  . Arthritis 2011  . Asthma 1967  . Hypertension 2005  . Personal history of colonic polyps   . 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    Past Surgical History  Procedure Laterality Date  . Knee surgery Left 2005  . Colonoscopy  2006  . Cholecystectomy    . Neck surgery      squamous cell carcinoma removal   . Tonsillectomy  04-26-13    and adenoids   Family History  Problem Relation Age of Onset  . Colon polyps Brother   . Breast cancer Maternal Aunt   . Throat cancer Maternal Uncle   . Colon cancer Father    Social History  Substance Use Topics  . Smoking status: Former Smoker -- 1.00 packs/day for 7 years  . Smokeless tobacco: Never Used  . Alcohol Use: Yes     Comment: occ    Review of Systems All other systems negative except as documented in the HPI. All pertinent positives and negatives as reviewed in the  HPI.   Allergies  Codeine  Home Medications   Prior to Admission medications   Medication Sig Start Date End Date Taking? Authorizing Provider  albuterol (PROVENTIL HFA;VENTOLIN HFA) 108 (90 BASE) MCG/ACT inhaler Inhale 1 puff into the lungs every 6 (six) hours as needed for wheezing or shortness of breath.   Yes Historical Provider, MD  ALPRAZolam Duanne Moron) 1 MG tablet Take 1 mg by mouth 3 (three) times daily as needed for anxiety or sleep.    Yes Historical Provider, MD  aspirin 325 MG tablet Take 650 mg by mouth every 6 (six) hours as needed for mild pain.   Yes Historical Provider, MD  DiphenhydrAMINE HCl (ZZZQUIL) 50 MG/30ML LIQD Take 30 mLs by mouth at bedtime as needed (sleep).   Yes Historical Provider, MD  lisinopril (PRINIVIL,ZESTRIL) 10 MG tablet Take 10 mg by mouth 2 (two) times daily.    Yes Historical Provider, MD   BP 134/89 mmHg  Pulse 85  Temp(Src) 98.3 F (36.8 C) (Oral)  Resp 14  Ht 5\' 7"  (1.702 m)  Wt 173 lb (78.472 kg)  BMI 27.09 kg/m2  SpO2 99% Physical Exam  Constitutional: He is oriented to person, place, and time. He appears well-developed and well-nourished. No distress.  HENT:  Head: Normocephalic and atraumatic.  Mouth/Throat: Oropharynx is clear and moist.  Eyes: Pupils are equal, round, and reactive to light.  Neck: Normal range of motion. Neck supple. Normal  carotid pulses present. Muscular tenderness present. No tracheal tenderness present. Carotid bruit is not present. No rigidity. No tracheal deviation and normal range of motion present. No thyroid mass and no thyromegaly present.  Cardiovascular: Normal rate, regular rhythm and normal heart sounds.  Exam reveals no gallop and no friction rub.   No murmur heard. Pulmonary/Chest: Effort normal and breath sounds normal. No stridor. No respiratory distress.  Musculoskeletal: He exhibits no edema.  Neurological: He is alert and oriented to person, place, and time. He exhibits normal muscle tone.  Coordination normal.  Skin: Skin is warm and dry. No rash noted. No erythema.  Psychiatric: He has a normal mood and affect. His behavior is normal.  Nursing note and vitals reviewed.   ED Course  Procedures (including critical care time) Labs Review Labs Reviewed  COMPREHENSIVE METABOLIC PANEL - Abnormal; Notable for the following:    Glucose, Bld 106 (*)    ALT 15 (*)    All other components within normal limits  URINALYSIS, ROUTINE W REFLEX MICROSCOPIC (NOT AT [facility]) - Abnormal; Notable for the following:    Hgb urine dipstick SMALL (*)    All other components within normal limits  URINE MICROSCOPIC-ADD ON - Abnormal; Notable for the following:    Casts HYALINE CASTS (*)    All other components within normal limits  LIPASE, BLOOD  CBC    Imaging Review Ct Soft Tissue Neck W Contrast  10/16/2014   CLINICAL DATA:  Initial evaluation for  EXAM: CT NECK WITH CONTRAST  TECHNIQUE: Multidetector CT imaging of the neck was performed using the standard protocol following the bolus administration of intravenous contrast.  CONTRAST:  27mL OMNIPAQUE IOHEXOL 300 MG/ML  SOLN  COMPARISON:  Prior CT from 03/08/2013.  FINDINGS: Visualized portions of the brain demonstrated normal appearance without acute abnormality. Partially visualized globes orbits within normal limits. Visualized paranasal sinuses and mastoid air cells are clear.  The parotid glands and left submandibular gland are normal in appearance. The residual right submandibular gland is asymmetrically small and hyperdense as compared to the left, like related to treatment effect.  Oral cavity within normal limits. Palatine tonsils not clearly visualize, and may be absent. Parapharyngeal 5 preserved. Nasopharynx within normal limits. The oropharynx is largely effaced, which may be related to patient's breath hold/relaxation of the palate. Epiglottis and vallecula not clearly visualized. The no retropharyngeal fluid collection. Piriform sinuses  grossly clear. Remainder of the hypopharynx and supraglottic larynx within normal limits. True vocal cords symmetric bilaterally. Subglottic airway clear. No definable or discrete mass present along the mucosal surfaces.  12 x 10 x 14 mm hypodense right thyroid nodule noted, stable. Thyroid gland is otherwise unremarkable.  Sequelae of prior right-sided lymph node dissection noted with surgical clips present within the right neck. No pathologically enlarged lymph nodes in of seen within this region. No left-sided cervical adenopathy. No supraclavicular adenopathy. No pathologically enlarged lymph nodes within the partially visualized superior mediastinum.  Normal intravascular enhancement seen within the neck. Medialization of the internal carotid arteries into the retropharyngeal space noted.  Partially visualized lungs are clear.  No acute osseous abnormality. 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 Moderate degenerative spondylolysis present within the cervical spine. No worrisome lytic or blastic osseous lesions.  IMPRESSION: 1. No acute abnormality identified within the neck. Sequelae of prior right-sided lymph node dissection without CT evidence for recurrent or new metastatic disease within the neck. Again, no definite discrete primary mass is  identified within the neck. 2. No other acute abnormality within the neck.   Electronically Signed   By: Jeannine Boga M.D.   On: 10/16/2014 23:28   I have personally reviewed and evaluated these images and lab results as part of my medical decision-making.  The patient will be given something help with sleep.  The patient will be advised follow-up with primary care Dr. told to return here as needed.  His laboratory testing and x-rays did not show any significant abnormalities.  The patient may have a mild viral infection.  Dalia Heading, PA-C 10/17/14 0011  Blanchie Dessert, MD 10/19/14 [phone]

## 2015-08-13 DIAGNOSIS — M79602 Pain in left arm: Secondary | ICD-10-CM | POA: Insufficient documentation

## 2015-08-13 DIAGNOSIS — M542 Cervicalgia: Secondary | ICD-10-CM | POA: Insufficient documentation

## 2015-08-13 DIAGNOSIS — R29898 Other symptoms and signs involving the musculoskeletal system: Secondary | ICD-10-CM | POA: Insufficient documentation

## 2015-09-28 DIAGNOSIS — J45909 Unspecified asthma, uncomplicated: Secondary | ICD-10-CM | POA: Insufficient documentation

## 2015-09-28 DIAGNOSIS — F419 Anxiety disorder, unspecified: Secondary | ICD-10-CM | POA: Insufficient documentation

## 2015-09-28 DIAGNOSIS — G8929 Other chronic pain: Secondary | ICD-10-CM | POA: Insufficient documentation

## 2015-09-28 DIAGNOSIS — I10 Essential (primary) hypertension: Secondary | ICD-10-CM | POA: Insufficient documentation

## 2015-10-18 ENCOUNTER — Encounter (HOSPITAL_COMMUNITY): Payer: Self-pay | Admitting: Emergency Medicine

## 2015-10-18 ENCOUNTER — Emergency Department (HOSPITAL_COMMUNITY): Payer: 59

## 2015-10-18 ENCOUNTER — Emergency Department (HOSPITAL_COMMUNITY)
Admission: EM | Admit: 2015-10-18 | Discharge: 2015-10-18 | Disposition: A | Discharge status: Home / Self Care | Payer: 59 | Attending: Emergency Medicine | Admitting: Emergency Medicine

## 2015-10-18 DIAGNOSIS — Z85828 Personal history of other malignant neoplasm of skin: Secondary | ICD-10-CM | POA: Insufficient documentation

## 2015-10-18 DIAGNOSIS — Z79899 Other long term (current) drug therapy: Secondary | ICD-10-CM | POA: Diagnosis not present

## 2015-10-18 DIAGNOSIS — Y9389 Activity, other specified: Secondary | ICD-10-CM | POA: Insufficient documentation

## 2015-10-18 DIAGNOSIS — Z87891 Personal history of nicotine dependence: Secondary | ICD-10-CM | POA: Insufficient documentation

## 2015-10-18 DIAGNOSIS — M546 Pain in thoracic spine: Secondary | ICD-10-CM | POA: Diagnosis not present

## 2015-10-18 DIAGNOSIS — M542 Cervicalgia: Secondary | ICD-10-CM | POA: Diagnosis present

## 2015-10-18 DIAGNOSIS — Y92410 Unspecified street and highway as the place of occurrence of the external cause: Secondary | ICD-10-CM | POA: Diagnosis not present

## 2015-10-18 DIAGNOSIS — Y999 Unspecified external cause status: Secondary | ICD-10-CM | POA: Diagnosis not present

## 2015-10-18 DIAGNOSIS — J45909 Unspecified asthma, uncomplicated: Secondary | ICD-10-CM | POA: Insufficient documentation

## 2015-10-18 DIAGNOSIS — I10 Essential (primary) hypertension: Secondary | ICD-10-CM | POA: Diagnosis not present

## 2015-10-18 MED ORDER — DIAZEPAM 10 MG PO TABS: 10.0000 mg | ORAL_TABLET | Freq: Three times a day (TID) | ORAL | 0 refills | Status: DC | PRN
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
## 2015-10-18 NOTE — Discharge Instructions (Signed)
Call your neurologist tomorrow to inform them of your visit to the emergency department today. Call your primary care provider tomorrow to be seen this week regarding your elevated blood pressure and to be reevaluated. Continue to take your Norco and diazepam as needed for pain and muscle spasms.  Return to the emergency department if you experience and headaches, worsening pain, headache, dizziness, numbness/timing, weakness, fever, or any other concerning symptoms.

## 2015-10-18 NOTE — ED Notes (Signed)
Pt ambulatory and independent at discharge.  Verbalized understanding of discharge instructions 
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
## 2015-10-31 DIAGNOSIS — Z9889 Other specified postprocedural states: Secondary | ICD-10-CM | POA: Insufficient documentation

## 2017-06-06 ENCOUNTER — Emergency Department (HOSPITAL_COMMUNITY)
Admission: EM | Admit: 2017-06-06 | Discharge: 2017-06-06 | Disposition: A | Discharge status: Home / Self Care | Payer: 59

## 2017-06-06 ENCOUNTER — Ambulatory Visit (HOSPITAL_COMMUNITY)
Admission: EM | Admit: 2017-06-06 | Discharge: 2017-06-06 | Disposition: A | Discharge status: Home / Self Care | Payer: Managed Care, Other (non HMO) | Attending: Family Medicine | Admitting: Family Medicine

## 2017-06-06 ENCOUNTER — Other Ambulatory Visit: Payer: Self-pay

## 2017-06-06 ENCOUNTER — Encounter (HOSPITAL_COMMUNITY): Payer: Self-pay | Admitting: Family Medicine

## 2017-06-06 DIAGNOSIS — F419 Anxiety disorder, unspecified: Secondary | ICD-10-CM | POA: Diagnosis not present

## 2017-06-06 DIAGNOSIS — Z79899 Other long term (current) drug therapy: Secondary | ICD-10-CM

## 2017-06-06 DIAGNOSIS — R110 Nausea: Secondary | ICD-10-CM | POA: Diagnosis not present

## 2017-06-06 NOTE — ED Provider Notes (Signed)
[name]    CSN: [id number] Arrival date & time: 06/06/17  1259     History   Chief Complaint Chief Complaint  Patient presents with  . Anxiety  . Spasms    HPI Lance Parsons is a 62 y.o. male.   62 yo male with chronic benzodiazapine use x 10 years presents with feeling anxious and shaky all over. He also admits to feeling nauseated and like something is "off." His PCP recommended he titrate down his benzos from 3 times per day. He is now taking 1 pill per day and since taking 1 pill per day, his symptoms began.      Past Medical History:  Diagnosis Date  . Arthritis 2011  . Asthma 1967  . 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   . Hypertension 2005  . Personal history of colonic polyps     There are no active problems to display for this patient.   Past Surgical History:  Procedure Laterality Date  . CHOLECYSTECTOMY    . COLONOSCOPY  2006  . KNEE SURGERY Left 2005  . NECK SURGERY     squamous cell carcinoma removal   . TONSILLECTOMY  04-26-13   and adenoids       Home Medications    Prior to Admission medications   Medication Sig Start Date End Date Taking? Authorizing Provider  albuterol (PROAIR HFA) 108 (90 Base) MCG/ACT inhaler Inhale 2 puffs into the lungs every 6 (six) hours as needed for wheezing or shortness of breath.  01/05/13   [provider]  bisacodyl (DULCOLAX) 5 MG EC tablet Take 15 mg by mouth at bedtime as needed for mild constipation or moderate constipation.    [provider]  diazepam (VALIUM) 10 MG tablet Take 1 tablet (10 mg total) by mouth 3 (three) times daily as needed (muscle spasms). 10/18/15   Focht, Fraser Din, PA  HYDROcodone-acetaminophen (NORCO/VICODIN) 5-325 MG tablet Take 1 tablet by mouth every 8 (eight) hours as needed for moderate pain or severe pain.  10/17/15   [provider]  lisinopril (PRINIVIL,ZESTRIL) 30 MG  tablet Take 30 mg by mouth 2 (two) times daily. 09/09/15   [provider]  zolpidem (AMBIEN) 5 MG tablet Take 1 tablet (5 mg total) by mouth at bedtime as needed for sleep. Patient not taking: Reported on 10/18/2015 10/17/14   Dalia Heading, PA-C    Family History Family History  Problem Relation Age of Onset  . Colon polyps Brother   . Breast cancer Maternal Aunt   . Throat cancer Maternal Uncle   . Colon cancer Father     Social History Social History   Tobacco Use  . Smoking status: Former Smoker    Packs/day: 1.00    Years: 7.00    Pack years: 7.00  . Smokeless tobacco: Never Used  Substance Use Topics  . Alcohol use: Yes    Comment: occ  . Drug use: No     Allergies   Tramadol and Codeine   Review of Systems Review of Systems  Constitutional: Negative for activity change and appetite change.  HENT: Negative for congestion and ear discharge.   Eyes: Negative for discharge and itching.  Respiratory: Negative for apnea and chest tightness.   Cardiovascular: Negative for chest pain and leg swelling.  Gastrointestinal: Negative for abdominal distention and abdominal pain.  Endocrine: Negative for cold intolerance and heat intolerance.  Genitourinary:  Negative for difficulty urinating and dysuria.  Musculoskeletal:       Muscle spasm  Neurological: Negative for dizziness and headaches.  Hematological: Negative for adenopathy. Does not bruise/bleed easily.  Psychiatric/Behavioral:       Anxiety     Physical Exam Triage Vital Signs ED Triage Vitals [06/06/17 1325]  Enc Vitals Group     BP (!) 160/114     Pulse Rate 77     Resp (!) 24     Temp 98.1 F (36.7 C)     Temp src      SpO2 98 %     Weight      Height      Head Circumference      Peak Flow      Pain Score 4     Pain Loc      Pain Edu?      Excl. in [place]?    No data found.  Updated Vital Signs BP (!) 148/100 (BP Location: Right Arm) Comment: reported BP to Nurse Kenton Kingfisher   Pulse 77   Temp 98.1 F (36.7 C)   Resp (!) 24   SpO2 98%   Visual Acuity Right Eye Distance:   Left Eye Distance:   Bilateral Distance:    Right Eye Near:   Left Eye Near:    Bilateral Near:     Physical Exam  Constitutional: He is oriented to person, place, and time. He appears well-developed and well-nourished.  HENT:  Head: Normocephalic and atraumatic.  Eyes: Pupils are equal, round, and reactive to light. EOM are normal.  Neck: Normal range of motion. Neck supple.  Pulmonary/Chest: Effort normal. No respiratory distress.  Musculoskeletal: Normal range of motion. He exhibits no edema.  Neurological: He is alert and oriented to person, place, and time.  Skin: Skin is warm and dry.     UC Treatments / Results  Labs (all labs ordered are listed, but only abnormal results are displayed) Labs Reviewed - No data to display  EKG None Radiology No results found.  Procedures Procedures (including critical care time)  Medications Ordered in UC Medications - No data to display   Initial Impression / Assessment and Plan / UC Course  I have reviewed the triage vital signs and the nursing notes.  Pertinent labs & imaging results that were available during my care of the patient were reviewed by me and considered in my medical decision making (see chart for details).     1. Chronic benzodiazepine use- advised patient return to 2 pills per day at this time and spend 1 week at new dose and cut pills in half to have less withdrawal symptoms. Follow up with PCP to discuss plan further.   Final Clinical Impressions(s) / UC Diagnoses   Final diagnoses:  None    ED Discharge Orders    None       Controlled Substance Prescriptions [place] Controlled Substance Registry consulted? Not Applicable   Dannielle Huh, DO 06/06/17 1427

## 2017-06-06 NOTE — ED Triage Notes (Signed)
Pt woke up today feeling anxious and having muscle cramps all over. Reports that he has been under a lot of stress lately. He is mildly anxious and hyperventilating in triage. Some chest tightness.

## 2017-08-11 ENCOUNTER — Other Ambulatory Visit: Payer: Self-pay | Admitting: Internal Medicine

## 2017-08-11 ENCOUNTER — Ambulatory Visit
Admission: RE | Admit: 2017-08-11 | Discharge: 2017-08-11 | Disposition: A | Discharge status: Home / Self Care | Payer: Managed Care, Other (non HMO) | Source: Ambulatory Visit | Attending: Internal Medicine | Admitting: Internal Medicine

## 2017-08-11 DIAGNOSIS — R05 Cough: Secondary | ICD-10-CM

## 2017-08-11 DIAGNOSIS — R059 Cough, unspecified: Secondary | ICD-10-CM

## 2018-09-25 ENCOUNTER — Encounter: Payer: Self-pay | Admitting: General Surgery

## 2019-03-10 DIAGNOSIS — M5417 Radiculopathy, lumbosacral region: Secondary | ICD-10-CM | POA: Insufficient documentation

## 2019-08-06 ENCOUNTER — Other Ambulatory Visit: Payer: Self-pay

## 2019-08-06 DIAGNOSIS — Z1211 Encounter for screening for malignant neoplasm of colon: Secondary | ICD-10-CM

## 2019-08-16 ENCOUNTER — Ambulatory Visit (INDEPENDENT_AMBULATORY_CARE_PROVIDER_SITE_OTHER): Payer: Self-pay | Admitting: Gastroenterology

## 2019-08-16 ENCOUNTER — Other Ambulatory Visit: Payer: Self-pay

## 2019-08-16 DIAGNOSIS — Z1211 Encounter for screening for malignant neoplasm of colon: Secondary | ICD-10-CM

## 2019-08-16 NOTE — Progress Notes (Signed)
Gastroenterology Pre-Procedure Review  Request Date: 09/13/19 Requesting Physician: Dr. Bonna Gains  PATIENT REVIEW QUESTIONS: The patient responded to the following health history questions as indicated:    1. Are you having any GI issues? no 2. Do you have a personal history of Polyps? yes (7-8 years ago pt had colon polyps.  ) 3. Do you have a family history of Colon Cancer or Polyps? father had stomach cancer 4. Diabetes Mellitus? no 5. Joint replacements in the past 12 months?no 6. Major health problems in the past 3 months?no 7. Any artificial heart valves, MVP, or defibrillator?no    MEDICATIONS & ALLERGIES:    Patient reports the following regarding taking any anticoagulation/antiplatelet therapy:   Plavix, Coumadin, Eliquis, Xarelto, Lovenox, Pradaxa, Brilinta, or Effient? no Aspirin? no  Patient confirms/reports the following medications:  Current Outpatient Medications  Medication Sig Dispense Refill  . albuterol (PROAIR HFA) 108 (90 Base) MCG/ACT inhaler Inhale 2 puffs into the lungs every 6 (six) hours as needed for wheezing or shortness of breath.     . ALPRAZolam (XANAX) 1 MG tablet Take 1 mg by mouth 2 (two) times daily as needed.    Marland Kitchen amLODipine (NORVASC) 2.5 MG tablet Take 2.5 mg by mouth daily.    Marland Kitchen gabapentin (NEURONTIN) 600 MG tablet Take by mouth.    Marland Kitchen HYDROcodone-acetaminophen (NORCO/VICODIN) 5-325 MG tablet Take 1 tablet by mouth every 8 (eight) hours as needed for moderate pain or severe pain.     Marland Kitchen lisinopril (PRINIVIL,ZESTRIL) 30 MG tablet Take 30 mg by mouth 2 (two) times daily.    Marland Kitchen zolpidem (AMBIEN) 5 MG tablet Take 1 tablet (5 mg total) by mouth at bedtime as needed for sleep. (Patient not taking: Reported on 10/18/2015) 5 tablet 0   No current facility-administered medications for this visit.    Patient confirms/reports the following allergies:  Allergies  Allergen Reactions  . Tramadol Nausea Only  . Codeine Swelling and Rash    No orders of the  defined types were placed in this encounter.   AUTHORIZATION INFORMATION Primary Insurance: 1D#: Group #:  Secondary Insurance: 1D#: Group #:  SCHEDULE INFORMATION: Date: 09/13/19 Time: Location:[facility]

## 2019-09-09 ENCOUNTER — Other Ambulatory Visit
Admission: RE | Admit: 2019-09-09 | Discharge: 2019-09-09 | Disposition: A | Discharge status: Home / Self Care | Payer: Managed Care, Other (non HMO) | Source: Ambulatory Visit | Attending: Gastroenterology | Admitting: Gastroenterology

## 2019-09-09 ENCOUNTER — Other Ambulatory Visit: Payer: Self-pay

## 2019-09-09 DIAGNOSIS — Z20822 Contact with and (suspected) exposure to covid-19: Secondary | ICD-10-CM | POA: Diagnosis present

## 2019-09-09 LAB — SARS CORONAVIRUS 2 (TAT 6-24 HRS): SARS Coronavirus 2: NEGATIVE

## 2019-09-13 ENCOUNTER — Ambulatory Visit: Payer: Managed Care, Other (non HMO) | Admitting: Anesthesiology

## 2019-09-13 ENCOUNTER — Encounter: Payer: Self-pay | Admitting: Gastroenterology

## 2019-09-13 ENCOUNTER — Other Ambulatory Visit: Payer: Self-pay

## 2019-09-13 ENCOUNTER — Encounter
Admission: RE | Disposition: A | Discharge status: Home / Self Care | Payer: Self-pay | Source: Ambulatory Visit | Attending: Gastroenterology

## 2019-09-13 ENCOUNTER — Ambulatory Visit
Admission: RE | Admit: 2019-09-13 | Discharge: 2019-09-13 | Disposition: A | Discharge status: Home / Self Care | Payer: Managed Care, Other (non HMO) | Source: Ambulatory Visit | Attending: Gastroenterology | Admitting: Gastroenterology

## 2019-09-13 DIAGNOSIS — Z86010 Personal history of colonic polyps: Secondary | ICD-10-CM | POA: Diagnosis not present

## 2019-09-13 DIAGNOSIS — D124 Benign neoplasm of descending colon: Secondary | ICD-10-CM | POA: Diagnosis not present

## 2019-09-13 DIAGNOSIS — D126 Benign neoplasm of colon, unspecified: Secondary | ICD-10-CM | POA: Diagnosis not present

## 2019-09-13 DIAGNOSIS — D120 Benign neoplasm of cecum: Secondary | ICD-10-CM | POA: Insufficient documentation

## 2019-09-13 DIAGNOSIS — D123 Benign neoplasm of transverse colon: Secondary | ICD-10-CM | POA: Diagnosis not present

## 2019-09-13 DIAGNOSIS — Z7951 Long term (current) use of inhaled steroids: Secondary | ICD-10-CM | POA: Insufficient documentation

## 2019-09-13 DIAGNOSIS — Z87891 Personal history of nicotine dependence: Secondary | ICD-10-CM | POA: Insufficient documentation

## 2019-09-13 DIAGNOSIS — G709 Myoneural disorder, unspecified: Secondary | ICD-10-CM | POA: Diagnosis not present

## 2019-09-13 DIAGNOSIS — Z9049 Acquired absence of other specified parts of digestive tract: Secondary | ICD-10-CM | POA: Insufficient documentation

## 2019-09-13 DIAGNOSIS — K640 First degree hemorrhoids: Secondary | ICD-10-CM | POA: Diagnosis not present

## 2019-09-13 DIAGNOSIS — Z888 Allergy status to other drugs, medicaments and biological substances status: Secondary | ICD-10-CM | POA: Insufficient documentation

## 2019-09-13 DIAGNOSIS — M1990 Unspecified osteoarthritis, unspecified site: Secondary | ICD-10-CM | POA: Insufficient documentation

## 2019-09-13 DIAGNOSIS — Z79899 Other long term (current) drug therapy: Secondary | ICD-10-CM | POA: Insufficient documentation

## 2019-09-13 DIAGNOSIS — Z1211 Encounter for screening for malignant neoplasm of colon: Secondary | ICD-10-CM | POA: Diagnosis not present

## 2019-09-13 DIAGNOSIS — Z885 Allergy status to narcotic agent status: Secondary | ICD-10-CM | POA: Diagnosis not present

## 2019-09-13 DIAGNOSIS — D122 Benign neoplasm of ascending colon: Secondary | ICD-10-CM | POA: Diagnosis not present

## 2019-09-13 DIAGNOSIS — I10 Essential (primary) hypertension: Secondary | ICD-10-CM | POA: Diagnosis not present

## 2019-09-13 DIAGNOSIS — J45909 Unspecified asthma, uncomplicated: Secondary | ICD-10-CM | POA: Diagnosis not present

## 2019-09-13 DIAGNOSIS — Z8371 Family history of colonic polyps: Secondary | ICD-10-CM | POA: Insufficient documentation

## 2019-09-13 DIAGNOSIS — Z800 Family history of malignant neoplasm of digestive organs: Secondary | ICD-10-CM | POA: Insufficient documentation

## 2019-09-13 DIAGNOSIS — Z803 Family history of malignant neoplasm of breast: Secondary | ICD-10-CM | POA: Diagnosis not present

## 2019-09-13 HISTORY — PX: COLONOSCOPY WITH PROPOFOL: SHX5780

## 2019-09-13 SURGERY — COLONOSCOPY WITH PROPOFOL
Anesthesia: General

## 2019-09-13 MED ORDER — PROPOFOL 10 MG/ML IV BOLUS
INTRAVENOUS | Status: AC
  Filled 2019-09-13: qty 20

## 2019-09-13 MED ORDER — PROPOFOL 500 MG/50ML IV EMUL
INTRAVENOUS | Status: DC | PRN
  Administered 2019-09-13: 175 ug/kg/min via INTRAVENOUS

## 2019-09-13 MED ORDER — PROPOFOL 500 MG/50ML IV EMUL
INTRAVENOUS | Status: AC
  Filled 2019-09-13: qty 50

## 2019-09-13 MED ORDER — LIDOCAINE HCL (CARDIAC) PF 100 MG/5ML IV SOSY
PREFILLED_SYRINGE | INTRAVENOUS | Status: DC | PRN
  Administered 2019-09-13: 50 mg via INTRAVENOUS

## 2019-09-13 MED ORDER — SODIUM CHLORIDE 0.9 % IV SOLN: INTRAVENOUS | Status: DC

## 2019-09-13 MED ORDER — PHENYLEPHRINE HCL (PRESSORS) 10 MG/ML IV SOLN
INTRAVENOUS | Status: DC | PRN
  Administered 2019-09-13: 100 ug via INTRAVENOUS

## 2019-09-13 MED ORDER — PROPOFOL 10 MG/ML IV BOLUS
INTRAVENOUS | Status: DC | PRN
  Administered 2019-09-13: 70 mg via INTRAVENOUS

## 2019-09-13 NOTE — Anesthesia Preprocedure Evaluation (Signed)
Anesthesia Evaluation  Patient identified by MRN, date of birth, ID band Patient awake    Reviewed: Allergy & Precautions, H&P , NPO status , Patient's Chart, lab work & pertinent test results  History of Anesthesia Complications Negative for: history of anesthetic complications  Airway Mallampati: III  TM Distance: <3 FB Neck ROM: limited    Dental  (+) Chipped, Poor Dentition, Missing   Pulmonary neg shortness of breath, asthma , former smoker,    Pulmonary exam normal        Cardiovascular Exercise Tolerance: Good hypertension, (-) angina(-) Past MI and (-) DOE Normal cardiovascular exam     Neuro/Psych  Neuromuscular disease negative psych ROS   GI/Hepatic negative GI ROS, Neg liver ROS, neg GERD  ,  Endo/Other  negative endocrine ROS  Renal/GU negative Renal ROS  negative genitourinary   Musculoskeletal  (+) Arthritis ,   Abdominal   Peds  Hematology negative hematology ROS (+)   Anesthesia Other Findings Past Medical History: 2011: Arthritis 1967: Asthma 04-26-13: Cancer ([place])     Comment:  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  2005: Hypertension No date: Personal history of colonic polyps  Past Surgical History: No date: CHOLECYSTECTOMY 2006: COLONOSCOPY 2005: KNEE SURGERY; Left No date: NECK SURGERY     Comment:  squamous cell carcinoma removal  04-26-13: TONSILLECTOMY     Comment:  and adenoids  BMI    Body Mass Index: 28.19 kg/m      Reproductive/Obstetrics negative OB ROS                             Anesthesia Physical Anesthesia Plan  ASA: III  Anesthesia Plan: General   Post-op Pain Management:    Induction: Intravenous  PONV Risk Score and Plan: Propofol infusion and TIVA  Airway Management Planned: Natural Airway and Nasal Cannula  Additional Equipment:   Intra-op Plan:   Post-operative Plan:    Informed Consent: I have reviewed the patients History and Physical, chart, labs and discussed the procedure including the risks, benefits and alternatives for the proposed anesthesia with the patient or authorized representative who has indicated his/her understanding and acceptance.     Dental Advisory Given  Plan Discussed with: Anesthesiologist, CRNA and Surgeon  Anesthesia Plan Comments: (Patient consented for risks of anesthesia including but not limited to:  - adverse reactions to medications - risk of intubation if required - damage to eyes, teeth, lips or other oral mucosa - nerve damage due to positioning  - sore throat or hoarseness - Damage to heart, brain, nerves, lungs, other parts of body or loss of life  Patient voiced understanding.)        Anesthesia Quick Evaluation

## 2019-09-13 NOTE — Anesthesia Postprocedure Evaluation (Signed)
Anesthesia Post Note  Patient: EDWARDS MCKELVIE  Procedure(s) Performed: COLONOSCOPY WITH PROPOFOL (N/A )  Patient location during evaluation: Endoscopy Anesthesia Type: General Level of consciousness: awake and alert Pain management: pain level controlled Vital Signs Assessment: post-procedure vital signs reviewed and stable Respiratory status: spontaneous breathing, nonlabored ventilation, respiratory function stable and patient connected to nasal cannula oxygen Cardiovascular status: blood pressure returned to baseline and stable Postop Assessment: no apparent nausea or vomiting Anesthetic complications: no   No complications documented.   Last Vitals:  Vitals:   09/13/19 0930 09/13/19 0940  BP:  (!) 140/95  Pulse: 72 78  Resp: (!) 29 (!) 22  Temp:    SpO2: 98% 99%    Last Pain:  Vitals:   09/13/19 0920  TempSrc: Temporal  PainSc:                  Precious Haws Toini Failla

## 2019-09-13 NOTE — H&P (Signed)
Jonathon Bellows, MD [address] [place], [place], [place], [address] [address], [place], [place], [place], [address] Phone: [phone]  Fax: [phone]  Primary Care Physician:  Albina Billet, MD   Pre-Procedure History & Physical: HPI:  Lance Parsons is a 64 y.o. male is here for an colonoscopy.   Past Medical History:  Diagnosis Date  . Arthritis 2011  . Asthma 1967  . 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   . Hypertension 2005  . Personal history of colonic polyps     Past Surgical History:  Procedure Laterality Date  . CHOLECYSTECTOMY    . COLONOSCOPY  2006  . KNEE SURGERY Left 2005  . NECK SURGERY     squamous cell carcinoma removal   . TONSILLECTOMY  04-26-13   and adenoids    Prior to Admission medications   Medication Sig Start Date End Date Taking? Authorizing Provider  albuterol (PROAIR HFA) 108 (90 Base) MCG/ACT inhaler Inhale 2 puffs into the lungs every 6 (six) hours as needed for wheezing or shortness of breath.  01/05/13  Yes [provider]  ALPRAZolam Duanne Moron) 1 MG tablet Take 1 mg by mouth 2 (two) times daily as needed. 08/09/19  Yes [provider]  amLODipine (NORVASC) 2.5 MG tablet Take 2.5 mg by mouth daily. 08/06/19  Yes [provider]  gabapentin (NEURONTIN) 600 MG tablet Take by mouth. 06/22/19  Yes [provider]  HYDROcodone-acetaminophen (NORCO/VICODIN) 5-325 MG tablet Take 1 tablet by mouth every 8 (eight) hours as needed for moderate pain or severe pain.  10/17/15  Yes [provider]  lisinopril (PRINIVIL,ZESTRIL) 30 MG tablet Take 30 mg by mouth 2 (two) times daily. 09/09/15  Yes [provider]  zolpidem (AMBIEN) 5 MG tablet Take 1 tablet (5 mg total) by mouth at bedtime as needed for sleep. Patient not taking: Reported on 10/18/2015 10/17/14   Dalia Heading, PA-C    Allergies as of 08/16/2019 - Review Complete  08/16/2019  Allergen Reaction Noted  . Tramadol Nausea Only 05/18/2014  . Codeine Swelling and Rash 09/10/2012    Family History  Problem Relation Age of Onset  . Colon polyps Brother   . Breast cancer Maternal Aunt   . Throat cancer Maternal Uncle   . Colon cancer Father     Social History   Socioeconomic History  . Marital status: Married    Spouse name: Not on file  . Number of children: Not on file  . Years of education: Not on file  . Highest education level: Not on file  Occupational History  . Not on file  Tobacco Use  . Smoking status: Former Smoker    Packs/day: 1.00    Years: 7.00    Pack years: 7.00  . Smokeless tobacco: Never Used  Vaping Use  . Vaping Use: Never used  Substance and Sexual Activity  . Alcohol use: Yes    Comment: occ  . Drug use: Yes    Types: Marijuana  . Sexual activity: Not on file  Other Topics Concern  . Not on file  Social History Narrative  . Not on file   Social Determinants of Health   Financial Resource Strain:   . Difficulty of Paying Living Expenses:   Food Insecurity:   . Worried About Charity fundraiser in the Last Year:   . [place] in the Last Year:  Transportation Needs:   . Film/video editor (Medical):   Marland Kitchen Lack of Transportation (Non-Medical):   Physical Activity:   . Days of Exercise per Week:   . Minutes of Exercise per Session:   Stress:   . Feeling of Stress :   Social Connections:   . Frequency of Communication with Friends and Family:   . Frequency of Social Gatherings with Friends and Family:   . Attends Religious Services:   . Active Member of Clubs or Organizations:   . Attends Archivist Meetings:   Marland Kitchen Marital Status:   Intimate Partner Violence:   . Fear of Current or Ex-Partner:   . Emotionally Abused:   Marland Kitchen Physically Abused:   . Sexually Abused:     Review of Systems: See HPI, otherwise negative ROS  Physical Exam: BP (!) 128/97   Pulse 85   Temp (!) 97.4 F  (36.3 C) (Temporal)   Ht 5\' 7"  (1.702 m)   Wt 81.6 kg   SpO2 99%   BMI 28.19 kg/m  General:   Alert,  pleasant and cooperative in NAD Head:  Normocephalic and atraumatic. Neck:  Supple; no masses or thyromegaly. Lungs:  Clear throughout to auscultation, normal respiratory effort.    Heart:  +S1, +S2, Regular rate and rhythm, No edema. Abdomen:  Soft, nontender and nondistended. Normal bowel sounds, without guarding, and without rebound.   Neurologic:  Alert and  oriented x4;  grossly normal neurologically.  Impression/Plan: Lance Parsons is here for an colonoscopy to be performed for surveillance due to prior history of colon polyps   Risks, benefits, limitations, and alternatives regarding  colonoscopy have been reviewed with the patient.  Questions have been answered.  All parties agreeable.   Jonathon Bellows, MD  09/13/2019, 8:37 AM

## 2019-09-13 NOTE — Op Note (Addendum)
[facility]astroenterology Patient Name: Jerrard Bradburn Procedure Date: 09/13/2019 8:43 AM MRN: [id number] Account #: [id number] Date of Birth: 10-Aug-1955 Admit Type: Outpatient Age: 64 Room: [facility] ENDO ROOM 4 Gender: Male Note Status: Finalized Procedure:             Colonoscopy Indications:           High risk colon cancer surveillance: Personal history                         of colonic polyps, Last colonoscopy: August 2013 Providers:             Jonathon Bellows MD, MD Referring MD:          Leona Carry. Hall Busing, MD (Referring MD) Medicines:             Propofol per Anesthesia, Monitored Anesthesia Care Complications:         No immediate complications. Procedure:             Pre-Anesthesia Assessment:                        - Prior to the procedure, a History and Physical was                         performed, and patient medications, allergies and                         sensitivities were reviewed. The patient's tolerance                         of previous anesthesia was reviewed.                        - The risks and benefits of the procedure and the                         sedation options and risks were discussed with the                         patient. All questions were answered and informed                         consent was obtained.                        - ASA Grade Assessment: II - A patient with mild                         systemic disease.                        After obtaining informed consent, the colonoscope was                         passed under direct vision. Throughout the procedure,                         the patient's blood pressure, pulse, and oxygen  saturations were monitored continuously. The                         Colonoscope was introduced through the anus and                         advanced to the the cecum, identified by the                         appendiceal orifice. The colonoscopy was performed                          with ease. The patient tolerated the procedure well.                         The quality of the bowel preparation was good. Findings:      A 3 mm polyp was found in the cecum. The polyp was sessile. The polyp       was removed with a cold biopsy forceps. Resection and retrieval were       complete.      A 15 mm polyp was found in the cecum. The polyp was sessile.       Preparations were made for mucosal resection. narro band imaging was       done to mark the borders of the lesion. [Inject]. [Type] was performed.       [Resection & Retrieval]. To prevent bleeding after the polypectomy, two       hemostatic clips were successfully placed. There was no bleeding at the       end of the procedure.      Three sessile polyps were found in the ascending colon. The polyps were       4 to 8 mm in size. These polyps were removed with a cold snare.       Resection and retrieval were complete.      A 10 mm polyp was found in the proximal ascending colon. The polyp was       sessile. Preparations were made for mucosal resection. narrow band       imaging was done to mark the borders of the lesion. [Inject]. [Type] was       performed. [Resection & Retrieval].      Three sessile polyps were found in the transverse colon. The polyps were       4 to 7 mm in size. These polyps were removed with a cold snare.       Resection and retrieval were complete.      Two sessile polyps were found in the descending colon. The polyps were 4       to 5 mm in size. These polyps were removed with a cold snare. Resection       and retrieval were complete.      Non-bleeding internal hemorrhoids were found during retroflexion. The       hemorrhoids were large and Grade I (internal hemorrhoids that do not       prolapse).      The exam was otherwise without abnormality on direct and retroflexion       views. Impression:            - One 3 mm polyp in the cecum, removed with a cold  biopsy forceps. Resected and retrieved.                        - One 15 mm polyp in the cecum, removed with mucosal                         resection. Resected and retrieved. Clips were placed.                        - Three 4 to 8 mm polyps in the ascending colon,                         removed with a cold snare. Resected and retrieved.                        - One 10 mm polyp in the proximal ascending colon,                         removed with mucosal resection. Resected and retrieved.                        - Three 4 to 7 mm polyps in the transverse colon,                         removed with a cold snare. Resected and retrieved.                        - Two 4 to 5 mm polyps in the descending colon,                         removed with a cold snare. Resected and retrieved.                        - Non-bleeding internal hemorrhoids.                        - The examination was otherwise normal on direct and                         retroflexion views.                        - Mucosal resection was performed. Resection was                         complete, and retrieval was complete.                        - Mucosal resection was performed. Resection and                         retrieval were complete. Recommendation:        - Discharge patient to home (with escort).                        - Resume previous diet.                        - Continue  present medications.                        - Await pathology results.                        - Repeat colonoscopy in 6 months for surveillance                         after piecemeal polypectomy. Procedure Code(s):     --- Professional ---                        [phone], Colonoscopy, flexible; with removal of                         tumor(s), polyp(s), or other lesion(s) by snare                         technique                        45380, 12, Colonoscopy, flexible; with biopsy, single                         or multiple Diagnosis Code(s):      --- Professional ---                        K63.5, Polyp of colon                        Z86.010, Personal history of colonic polyps                        K64.0, First degree hemorrhoids CPT copyright 2019 American Medical Association. All rights reserved. The codes documented in this report are preliminary and upon coder review may  be revised to meet current compliance requirements. Jonathon Bellows, MD Jonathon Bellows MD, MD 09/13/2019 9:29:21 AM This report has been signed electronically. Number of Addenda: 0 Note Initiated On: 09/13/2019 8:43 AM Scope Withdrawal Time: 0 hours 33 minutes 28 seconds  Total Procedure Duration: 0 hours 34 minutes 55 seconds  Estimated Blood Loss:  Estimated blood loss: none.      [facility]

## 2019-09-13 NOTE — Transfer of Care (Signed)
Immediate Anesthesia Transfer of Care Note  Patient: Lance Parsons  Procedure(s) Performed: COLONOSCOPY WITH PROPOFOL (N/A )  Patient Location: PACU  Anesthesia Type:General  Level of Consciousness: drowsy  Airway & Oxygen Therapy: Patient Spontanous Breathing  Post-op Assessment: Report given to RN and Post -op Vital signs reviewed and stable  Post vital signs: Reviewed and stable  Last Vitals:  Vitals Value Taken Time  BP 120/81 09/13/19 0929  Temp    Pulse 73 09/13/19 0929  Resp 19 09/13/19 0929  SpO2 98 % 09/13/19 0929    Last Pain:  Vitals:   09/13/19 0920  TempSrc: Temporal  PainSc:          Complications: No complications documented.

## 2019-09-13 NOTE — Anesthesia Procedure Notes (Signed)
Date/Time: 09/13/2019 8:44 AM Performed by: Johnna Acosta, CRNA Pre-anesthesia Checklist: Patient identified, Emergency Drugs available, Suction available, Patient being monitored and Timeout performed Patient Re-evaluated:Patient Re-evaluated prior to induction Oxygen Delivery Method: Nasal cannula Preoxygenation: Pre-oxygenation with 100% oxygen Induction Type: IV induction

## 2019-09-14 ENCOUNTER — Encounter: Payer: Self-pay | Admitting: Gastroenterology

## 2019-09-14 LAB — SURGICAL PATHOLOGY

## 2019-09-17 ENCOUNTER — Telehealth: Payer: Self-pay

## 2019-09-17 DIAGNOSIS — Z86010 Personal history of colonic polyps: Secondary | ICD-10-CM

## 2019-09-17 NOTE — Telephone Encounter (Signed)
Pt has been notified of results and Dr. Georgeann Oppenheim recommendations. Pt agrees with genetics referral.

## 2019-09-17 NOTE — Telephone Encounter (Signed)
-----   Message from Jonathon Bellows, MD sent at 09/14/2019 12:32 PM EDT ----- Inform all polyps were pre cancerous- repeat procedure in 6 months as some polyps taken out in pieces. Also suggest genetic testing referral as he had numerous polyps

## 2019-09-17 NOTE — Telephone Encounter (Signed)
Called pt to inform of results and Dr. Anna's recommendations.  Unable to contact, LVM to return call 

## 2019-12-16 ENCOUNTER — Ambulatory Visit: Payer: Managed Care, Other (non HMO) | Admitting: Podiatry

## 2019-12-17 ENCOUNTER — Ambulatory Visit (INDEPENDENT_AMBULATORY_CARE_PROVIDER_SITE_OTHER): Payer: Managed Care, Other (non HMO)

## 2019-12-17 ENCOUNTER — Other Ambulatory Visit: Payer: Self-pay

## 2019-12-17 ENCOUNTER — Ambulatory Visit: Payer: Managed Care, Other (non HMO) | Admitting: Podiatry

## 2019-12-17 VITALS — BP 145/89 | HR 80 | Temp 97.8°F

## 2019-12-17 DIAGNOSIS — M79671 Pain in right foot: Secondary | ICD-10-CM | POA: Diagnosis not present

## 2019-12-17 DIAGNOSIS — M722 Plantar fascial fibromatosis: Secondary | ICD-10-CM | POA: Diagnosis not present

## 2019-12-17 MED ORDER — MELOXICAM 15 MG PO TABS
15.0000 mg | ORAL_TABLET | Freq: Every day | ORAL | 0 refills | Status: DC
Start: 2019-12-17 — End: 2020-02-14

## 2019-12-17 NOTE — Patient Instructions (Signed)

## 2019-12-18 NOTE — Progress Notes (Signed)
Subjective:   Patient ID: Lance Parsons, male   DOB: 64 y.o.   MRN: [id number]   HPI 64 year old male presents the office today for concerns of right heel pain which is been ongoing for last 4 months and he points on the medial aspect of the heel where the majority discomfort.  Denies any recent injury or trauma.  He said no recent treatment.  No radiating pain or weakness.  No other concerns today.   Review of Systems  All other systems reviewed and are negative.  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    Hypertension 2005   Personal history of colonic polyps     Past Surgical History:  Procedure Laterality Date   CHOLECYSTECTOMY     COLONOSCOPY  2006   COLONOSCOPY WITH PROPOFOL N/A 09/13/2019   Procedure: COLONOSCOPY WITH PROPOFOL;  Surgeon: Jonathon Bellows, MD;  Location: [facility] ENDOSCOPY;  Service: Endoscopy;  Laterality: N/A;   KNEE SURGERY Left 2005   NECK SURGERY     squamous cell carcinoma removal    TONSILLECTOMY  04-26-13   and adenoids     Current Outpatient Medications:    albuterol (PROAIR HFA) 108 (90 Base) MCG/ACT inhaler, Inhale 2 puffs into the lungs every 6 (six) hours as needed for wheezing or shortness of breath. , Disp: , Rfl:    ALPRAZolam (XANAX) 1 MG tablet, Take 1 mg by mouth 2 (two) times daily as needed., Disp: , Rfl:    amLODipine (NORVASC) 2.5 MG tablet, Take 2.5 mg by mouth daily., Disp: , Rfl:    gabapentin (NEURONTIN) 600 MG tablet, Take by mouth., Disp: , Rfl:    HYDROcodone-acetaminophen (NORCO/VICODIN) 5-325 MG tablet, Take 1 tablet by mouth every 8 (eight) hours as needed for moderate pain or severe pain. , Disp: , Rfl:    lisinopril (PRINIVIL,ZESTRIL) 30 MG tablet, Take 30 mg by mouth 2 (two) times daily., Disp: , Rfl:    meloxicam (MOBIC) 15 MG tablet, Take 1 tablet (15 mg total) by mouth  daily., Disp: 30 tablet, Rfl: 0   zolpidem (AMBIEN) 5 MG tablet, Take 1 tablet (5 mg total) by mouth at bedtime as needed for sleep. (Patient not taking: Reported on 10/18/2015), Disp: 5 tablet, Rfl: 0  Allergies  Allergen Reactions   Tramadol Nausea Only   Codeine Swelling and Rash         Objective:  Physical Exam  General: AAO x3, NAD  Dermatological: Skin is warm, dry and supple bilateral. There are no open sores, no preulcerative lesions, no rash or signs of infection present.  Vascular: Dorsalis Pedis artery and Posterior Tibial artery pedal pulses are 2/4 bilateral with immedate capillary fill time. There is no pain with calf compression, swelling, warmth, erythema.   Neruologic: Grossly intact via light touch bilateral. Negative tinel sign.  Musculoskeletal: Tenderness to palpation along the plantar medial tubercle of the calcaneus at the insertion of plantar fascia on the right foot. There is no pain along the course of the plantar fascia within the arch of the foot. Plantar fascia appears to be intact. There is no pain with lateral compression of the calcaneus or pain with vibratory sensation. There is no pain along the course or insertion of the achilles tendon. No other areas of tenderness to bilateral lower extremities. Muscular strength 5/5 in all groups tested  bilateral.  Gait: Unassisted, Nonantalgic.       Assessment:   Right heel pain, plantar fasciitis     Plan:  -Treatment options discussed including all alternatives, risks, and complications -Etiology of symptoms were discussed -X-rays were obtained and reviewed with the patient.  No evidence of acute fracture or stress fracture identified today. -Steroid injection performed.  See procedure note below. -Plantar fascial brace dispensed -Prescribed mobic. Discussed side effects of the medication and directed to stop if any are to occur and call the office.  -Stretching/icing daily -Discussed shoe  modifications and orthotics  Procedure: Injection Tendon/Ligament Discussed alternatives, risks, complications and verbal consent was obtained.  Location: Right plantar fascia at the glabrous junction; medial approach. Skin Prep: Alcohol Injectate: 0.5cc 0.5% marcaine plain, 0.5 cc 2% lidocaine plain and, 1 cc kenalog 10. Disposition: Patient tolerated procedure well. Injection site dressed with a band-aid.  Post-injection care was discussed and return precautions discussed.   Return in about 4 weeks (around 01/14/2020), or if symptoms worsen or fail to improve.  Trula Slade DPM

## 2020-01-18 ENCOUNTER — Ambulatory Visit: Payer: Managed Care, Other (non HMO) | Admitting: Podiatry

## 2020-02-09 ENCOUNTER — Other Ambulatory Visit: Payer: Self-pay

## 2020-02-09 ENCOUNTER — Encounter (HOSPITAL_COMMUNITY): Payer: Self-pay | Admitting: Emergency Medicine

## 2020-02-09 ENCOUNTER — Ambulatory Visit (HOSPITAL_COMMUNITY)
Admission: EM | Admit: 2020-02-09 | Discharge: 2020-02-09 | Disposition: A | Discharge status: Home / Self Care | Payer: Managed Care, Other (non HMO) | Attending: Emergency Medicine | Admitting: Emergency Medicine

## 2020-02-09 DIAGNOSIS — J069 Acute upper respiratory infection, unspecified: Secondary | ICD-10-CM

## 2020-02-09 DIAGNOSIS — J4541 Moderate persistent asthma with (acute) exacerbation: Secondary | ICD-10-CM

## 2020-02-09 MED ORDER — PREDNISONE 10 MG (21) PO TBPK
ORAL_TABLET | ORAL | 0 refills | Status: DC
Start: 2020-02-09 — End: 2020-02-18

## 2020-02-09 NOTE — ED Provider Notes (Signed)
HPI  SUBJECTIVE:  Lance Parsons is a 64 y.o. male who presents with cough, chest tightness, wheezing, shortness of breath, dyspnea on exertion for the past 3 days that is typical of previous asthma exacerbations.  He states that there is a viral illness going around the household.  He reports headaches, nasal congestion, rhinorrhea, which have resolved.  Reports 2 episodes of diarrhea.  No fevers, body aches, postnasal drip, sore throat, cough, nausea, vomiting, abdominal pain.  No known Covid or flu exposure.  He got both the Covid and flu vaccines.  He has required his rescue albuterol inhaler or albuterol nebulizer treatment using spacer every 1-2 hours with improvement in wheezing.  Symptoms are worse with exertion.  He has a past medical history of asthma, remote history of admission.  No recent steroid use, intubations.  He has a history of COVID in August, hypertension.  No history of diabetes.  SWN:IOEV, Leona Carry, MD   Past Medical History:  Diagnosis Date  . Arthritis 2011  . Asthma 1967  . 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   . Hypertension 2005  . Personal history of colonic polyps     Past Surgical History:  Procedure Laterality Date  . CHOLECYSTECTOMY    . COLONOSCOPY  2006  . COLONOSCOPY WITH PROPOFOL N/A 09/13/2019   Procedure: COLONOSCOPY WITH PROPOFOL;  Surgeon: Jonathon Bellows, MD;  Location: [facility] ENDOSCOPY;  Service: Endoscopy;  Laterality: N/A;  . KNEE SURGERY Left 2005  . NECK SURGERY     squamous cell carcinoma removal   . TONSILLECTOMY  04-26-13   and adenoids    Family History  Problem Relation Age of Onset  . Colon polyps Brother   . Breast cancer Maternal Aunt   . Throat cancer Maternal Uncle   . Colon cancer Father     Social History   Tobacco Use  . Smoking status: Former Smoker    Packs/day: 1.00    Years: 7.00    Pack years: 7.00  . Smokeless tobacco: Never Used  Vaping Use  .  Vaping Use: Never used  Substance Use Topics  . Alcohol use: Yes    Comment: occ  . Drug use: Yes    Types: Marijuana    No current facility-administered medications for this encounter.  Current Outpatient Medications:  .  albuterol (PROVENTIL) (2.5 MG/3ML) 0.083% nebulizer solution, Take 2.5 mg by nebulization every 6 (six) hours as needed for wheezing or shortness of breath., Disp: , Rfl:  .  albuterol (VENTOLIN HFA) 108 (90 Base) MCG/ACT inhaler, Inhale 2 puffs into the lungs every 6 (six) hours as needed for wheezing or shortness of breath. , Disp: , Rfl:  .  amLODipine (NORVASC) 2.5 MG tablet, Take 2.5 mg by mouth daily., Disp: , Rfl:  .  gabapentin (NEURONTIN) 600 MG tablet, Take by mouth., Disp: , Rfl:  .  lisinopril (PRINIVIL,ZESTRIL) 30 MG tablet, Take 30 mg by mouth 2 (two) times daily., Disp: , Rfl:  .  ALPRAZolam (XANAX) 1 MG tablet, Take 1 mg by mouth 2 (two) times daily as needed., Disp: , Rfl:  .  meloxicam (MOBIC) 15 MG tablet, Take 1 tablet (15 mg total) by mouth daily., Disp: 30 tablet, Rfl: 0 .  predniSONE (STERAPRED UNI-PAK 21 TAB) 10 MG (21) TBPK tablet, Dispense one 6 day pack. Take as directed with food., Disp: 21 tablet, Rfl: 0  Allergies  Allergen Reactions  .  Tramadol Nausea Only  . Codeine Swelling and Rash     ROS  As noted in HPI.   Physical Exam  BP (!) 143/105 (BP Location: Left Arm)   Pulse 99   Temp 98.9 F (37.2 C) (Oral)   Resp (!) 26   SpO2 98%   Constitutional: Well developed, well nourished, no acute respiratory distress but slightly dyspneic when talking.  Able to speak in full sentences. Eyes:  EOMI, conjunctiva normal bilaterally HENT: Normocephalic, atraumatic,mucus membranes moist.  No nasal congestion. Respiratory: Normal inspiratory effort, fair air movement, diffuse expiratory wheezing throughout all lung fields, no rales, rhonchi.  No chest wall tenderness Cardiovascular: Normal rate regular rhythm no murmurs or gallop GI:  nondistended skin: No rash, skin intact Musculoskeletal: no deformities Neurologic: Alert & oriented x 3, no focal neuro deficits Psychiatric: Speech and behavior appropriate   ED Course   Medications - No data to display  No orders of the defined types were placed in this encounter.   No results found for this or any previous visit (from the past 24 hour(s)). No results found.  ED Clinical Impression  1. Moderate persistent asthma with exacerbation   2. Upper respiratory tract infection, unspecified type      ED Assessment/Plan  Patient with a viral illness causing asthma exacerbation.  We talked about getting flu and Covid testing as it would change management, but patient has declined to get it today.  He will return to the clinic in several days if necessary we can test him then.  He has a pulse oximeter at home, will monitor his oxygen levels.  Will send home with a 6-day prednisone taper.  He states that he does not need any more albuterol nor another spacer.  He is to do regularly scheduled albuterol for the next 4 days and then as needed.  Follow-up with PMD as needed, to the ER if he gets worse.  Discussed  MDM, treatment plan, and plan for follow-up with patient. Discussed sn/sx that should prompt return to the ED. patient agrees with plan.   Meds ordered this encounter  Medications  . predniSONE (STERAPRED UNI-PAK 21 TAB) 10 MG (21) TBPK tablet    Sig: Dispense one 6 day pack. Take as directed with food.    Dispense:  21 tablet    Refill:  0    *This clinic note was created using [employer]. Therefore, there may be occasional mistakes despite careful proofreading.   ?    Melynda Ripple, MD 02/10/20 [phone]

## 2020-02-09 NOTE — ED Triage Notes (Signed)
Pt c/o shortness of breath, asthma, and cough x 2 days. Pt states there is a viral illness going around his house. Pt states his granddaughter and daughter have been sick. Pt states he tested positive for covid in August.

## 2020-02-09 NOTE — Discharge Instructions (Addendum)
2 puffs from your albuterol inhaler using your spacer or 1 nebulizer treatment every 4 hours for 2 days, then every 6 hours for 2 days, then as needed.  Finish the prednisone even if you feel better.  Keep an eye on your oxygen saturation.  Go to ER if it is consistently below 90%.  Go to the ER for any worsening of your symptoms.

## 2020-02-14 ENCOUNTER — Telehealth (INDEPENDENT_AMBULATORY_CARE_PROVIDER_SITE_OTHER): Payer: Self-pay | Admitting: Gastroenterology

## 2020-02-14 DIAGNOSIS — Z86010 Personal history of colonic polyps: Secondary | ICD-10-CM

## 2020-02-14 MED ORDER — NA SULFATE-K SULFATE-MG SULF 17.5-3.13-1.6 GM/177ML PO SOLN: 1.0000 | Freq: Once | ORAL | 0 refills | Status: AC

## 2020-02-14 NOTE — Progress Notes (Signed)
Gastroenterology Pre-Procedure Review  Request Date: 03/07/20 Requesting Physician: Dr. Vicente Males  PATIENT REVIEW QUESTIONS: The patient responded to the following health history questions as indicated:    1. Are you having any GI issues? no 2. Do you have a personal history of Polyps? yes (09/13/19 Dr. Vicente Males performed colonoscopy.  Noted polyps.) 3. Do you have a family history of Colon Cancer or Polyps? yes (father stomach cancer) 4. Diabetes Mellitus? no 5. Joint replacements in the past 12 months?no 6. Major health problems in the past 3 months?ER Visit 02/09/20 Asthma, URI.  Completing his last dose of antibiotics today. 7. Any artificial heart valves, MVP, or defibrillator?no    MEDICATIONS & ALLERGIES:    Patient reports the following regarding taking any anticoagulation/antiplatelet therapy:   Plavix, Coumadin, Eliquis, Xarelto, Lovenox, Pradaxa, Brilinta, or Effient? no Aspirin? no  Patient confirms/reports the following medications:  Current Outpatient Medications  Medication Sig Dispense Refill   albuterol (VENTOLIN HFA) 108 (90 Base) MCG/ACT inhaler Inhale 2 puffs into the lungs every 6 (six) hours as needed for wheezing or shortness of breath.      amLODipine (NORVASC) 2.5 MG tablet Take 2.5 mg by mouth daily.     gabapentin (NEURONTIN) 600 MG tablet Take by mouth.     HYDROcodone-acetaminophen (NORCO/VICODIN) 5-325 MG tablet Take 1 tablet by mouth every 6 (six) hours as needed for moderate pain.     lisinopril (PRINIVIL,ZESTRIL) 30 MG tablet Take 30 mg by mouth 2 (two) times daily.     predniSONE (STERAPRED UNI-PAK 21 TAB) 10 MG (21) TBPK tablet Dispense one 6 day pack. Take as directed with food. 21 tablet 0   albuterol (PROVENTIL) (2.5 MG/3ML) 0.083% nebulizer solution Take 2.5 mg by nebulization every 6 (six) hours as needed for wheezing or shortness of breath. (Patient not taking: No sig reported)     ALPRAZolam (XANAX) 1 MG tablet Take 1 mg by mouth 2 (two) times  daily as needed. (Patient not taking: No sig reported)     No current facility-administered medications for this visit.    Patient confirms/reports the following allergies:  Allergies  Allergen Reactions   Tramadol Nausea Only   Codeine Swelling and Rash    No orders of the defined types were placed in this encounter.   AUTHORIZATION INFORMATION Primary Insurance: 1D#: Group #:  Secondary Insurance: 1D#: Group #:  SCHEDULE INFORMATION: Date: 03/07/20 Time: Location:[facility]

## 2020-02-17 ENCOUNTER — Telehealth (HOSPITAL_COMMUNITY): Payer: Self-pay | Admitting: Emergency Medicine

## 2020-02-17 MED ORDER — ALBUTEROL SULFATE HFA 108 (90 BASE) MCG/ACT IN AERS
2.0000 | INHALATION_SPRAY | Freq: Four times a day (QID) | RESPIRATORY_TRACT | 0 refills | Status: AC | PRN
Start: 2020-02-17 — End: ?

## 2020-02-17 MED ORDER — ALBUTEROL SULFATE (2.5 MG/3ML) 0.083% IN NEBU
2.5000 mg | INHALATION_SOLUTION | Freq: Four times a day (QID) | RESPIRATORY_TRACT | 0 refills | Status: DC | PRN
Start: 2020-02-17 — End: 2023-05-12

## 2020-02-18 ENCOUNTER — Encounter (HOSPITAL_COMMUNITY): Payer: Self-pay | Admitting: Emergency Medicine

## 2020-02-18 ENCOUNTER — Other Ambulatory Visit: Payer: Self-pay

## 2020-02-18 ENCOUNTER — Emergency Department (HOSPITAL_COMMUNITY): Payer: Managed Care, Other (non HMO)

## 2020-02-18 ENCOUNTER — Emergency Department (HOSPITAL_COMMUNITY)
Admission: EM | Admit: 2020-02-18 | Discharge: 2020-02-18 | Disposition: A | Discharge status: Home / Self Care | Payer: Managed Care, Other (non HMO) | Attending: Emergency Medicine | Admitting: Emergency Medicine

## 2020-02-18 DIAGNOSIS — R0602 Shortness of breath: Secondary | ICD-10-CM | POA: Diagnosis present

## 2020-02-18 DIAGNOSIS — Z87891 Personal history of nicotine dependence: Secondary | ICD-10-CM | POA: Insufficient documentation

## 2020-02-18 DIAGNOSIS — I10 Essential (primary) hypertension: Secondary | ICD-10-CM | POA: Diagnosis not present

## 2020-02-18 DIAGNOSIS — Z20822 Contact with and (suspected) exposure to covid-19: Secondary | ICD-10-CM | POA: Insufficient documentation

## 2020-02-18 DIAGNOSIS — J4521 Mild intermittent asthma with (acute) exacerbation: Secondary | ICD-10-CM | POA: Diagnosis not present

## 2020-02-18 DIAGNOSIS — Z79899 Other long term (current) drug therapy: Secondary | ICD-10-CM | POA: Insufficient documentation

## 2020-02-18 DIAGNOSIS — J45901 Unspecified asthma with (acute) exacerbation: Secondary | ICD-10-CM

## 2020-02-18 DIAGNOSIS — R509 Fever, unspecified: Secondary | ICD-10-CM | POA: Diagnosis not present

## 2020-02-18 DIAGNOSIS — Z85828 Personal history of other malignant neoplasm of skin: Secondary | ICD-10-CM | POA: Diagnosis not present

## 2020-02-18 LAB — CBC
HCT: 45.4 % (ref 39.0–52.0)
Hemoglobin: 15.3 g/dL (ref 13.0–17.0)
MCH: 32.8 pg (ref 26.0–34.0)
MCHC: 33.7 g/dL (ref 30.0–36.0)
MCV: 97.4 fL (ref 80.0–100.0)
Platelets: 297 10*3/uL (ref 150–400)
RBC: 4.66 MIL/uL (ref 4.22–5.81)
RDW: 12.3 % (ref 11.5–15.5)
WBC: 11 10*3/uL — ABNORMAL HIGH (ref 4.0–10.5)
nRBC: 0 % (ref 0.0–0.2)

## 2020-02-18 LAB — BASIC METABOLIC PANEL
Anion gap: 9 (ref 5–15)
BUN: 5 mg/dL — ABNORMAL LOW (ref 8–23)
CO2: 22 mmol/L (ref 22–32)
Calcium: 8.6 mg/dL — ABNORMAL LOW (ref 8.9–10.3)
Chloride: 107 mmol/L (ref 98–111)
Creatinine, Ser: 0.82 mg/dL (ref 0.61–1.24)
GFR, Estimated: >60 mL/min (ref >60)
Glucose, Bld: 101 mg/dL — ABNORMAL HIGH (ref 70–99)
Potassium: 3.7 mmol/L (ref 3.5–5.1)
Sodium: 138 mmol/L (ref 135–145)

## 2020-02-18 LAB — RESP PANEL BY RT-PCR (FLU A&B, COVID) ARPGX2
Influenza A by PCR: NEGATIVE
Influenza B by PCR: NEGATIVE
SARS Coronavirus 2 by RT PCR: NEGATIVE

## 2020-02-18 MED ORDER — PREDNISONE 20 MG PO TABS: 60.0000 mg | ORAL_TABLET | Freq: Every day | ORAL | 0 refills | Status: AC

## 2020-02-18 MED ORDER — IPRATROPIUM-ALBUTEROL 0.5-2.5 (3) MG/3ML IN SOLN
3.0000 mL | Freq: Once | RESPIRATORY_TRACT | Status: AC
  Administered 2020-02-18: 3 mL via RESPIRATORY_TRACT
  Filled 2020-02-18: qty 3

## 2020-02-18 MED ORDER — PREDNISONE 20 MG PO TABS
60.0000 mg | ORAL_TABLET | Freq: Once | ORAL | Status: AC
  Administered 2020-02-18: 60 mg via ORAL
  Filled 2020-02-18: qty 3

## 2020-02-18 MED ORDER — ALBUTEROL SULFATE HFA 108 (90 BASE) MCG/ACT IN AERS
8.0000 | INHALATION_SPRAY | Freq: Once | RESPIRATORY_TRACT | Status: AC
  Administered 2020-02-18: 8 via RESPIRATORY_TRACT
  Filled 2020-02-18: qty 6.7

## 2020-02-18 NOTE — ED Triage Notes (Signed)
Pt arrives to ED with complaints of shortness of breath and wheezing x14 days. Pt states he was prescribed Prednisone at [facility] on the 12/15 that gave little relief. Pt states fevers, diaphoresies, cough, DOE, and now dyspnea during rest. Had COVID in August, family is also sick at this time. Hx of asthma.

## 2020-02-18 NOTE — ED Provider Notes (Signed)
[facility] EMERGENCY DEPARTMENT Provider Note   CSN: [id number] Arrival date & time: 02/18/20  [id number]     History Chief Complaint  Patient presents with  . Shortness of Breath    Lance Parsons is a 64 y.o. male.  The history is provided by the patient.  Shortness of Breath Severity:  Moderate Onset quality:  Gradual Duration:  3 days Timing:  Intermittent Progression:  Waxing and waning Chronicity:  New Context: URI   Relieved by:  Nothing Worsened by:  Nothing Associated symptoms: cough, fever and wheezing   Associated symptoms: no abdominal pain, no chest pain, no claudication, no ear pain, no rash, no sore throat, no sputum production and no vomiting   Risk factors: no hx of PE/DVT, no recent surgery and no tobacco use        Past Medical History:  Diagnosis Date  . Arthritis 2011  . Asthma 1967  . 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   . Hypertension 2005  . Personal history of colonic polyps     Patient Active Problem List   Diagnosis Date Noted  . Lumbosacral radiculopathy 03/10/2019  . 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 Anxiety 09/28/2015  . Asthma 09/28/2015  . Benign essential HTN 09/28/2015  . Chronic pain 09/28/2015  . Left arm pain 08/13/2015  . Left arm weakness 08/13/2015  . Neck pain 08/13/2015    Past Surgical History:  Procedure Laterality Date  . CHOLECYSTECTOMY    . COLONOSCOPY  2006  . COLONOSCOPY WITH PROPOFOL N/A 09/13/2019   Procedure: COLONOSCOPY WITH PROPOFOL;  Surgeon: Jonathon Bellows, MD;  Location: [facility] ENDOSCOPY;  Service: Endoscopy;  Laterality: N/A;  . KNEE SURGERY Left 2005  . NECK SURGERY     squamous cell carcinoma removal   . TONSILLECTOMY  04-26-13   and adenoids       Family History  Problem Relation Age of Onset  . Colon polyps Brother   . Breast cancer Maternal Aunt    . Throat cancer Maternal Uncle   . Colon cancer Father     Social History   Tobacco Use  . Smoking status: Former Smoker    Packs/day: 1.00    Years: 7.00    Pack years: 7.00  . Smokeless tobacco: Never Used  Vaping Use  . Vaping Use: Never used  Substance Use Topics  . Alcohol use: Yes    Comment: occ  . Drug use: Yes    Types: Marijuana    Home Medications Prior to Admission medications   Medication Sig Start Date End Date Taking? Authorizing Provider  albuterol (PROVENTIL) (2.5 MG/3ML) 0.083% nebulizer solution Take 3 mLs (2.5 mg total) by nebulization every 6 (six) hours as needed for wheezing or shortness of breath. Patient taking differently: Take 2.5 mg by nebulization every 4 (four) hours as needed for wheezing or shortness of breath. 02/17/20  Yes Melynda Ripple, MD  albuterol (VENTOLIN HFA) 108 (90 Base) MCG/ACT inhaler Inhale 2 puffs into the lungs every 6 (six) hours as needed for wheezing or shortness of breath. Patient taking differently: Inhale 2 puffs into the lungs every 4 (four) hours as needed for wheezing or shortness of breath. 02/17/20  Yes Melynda Ripple, MD  ALPRAZolam Duanne Moron) 1 MG tablet Take 0.5 mg by mouth 3 (three) times daily as needed for anxiety. 08/09/19  Yes [provider]  amLODipine (NORVASC) 2.5 MG tablet Take 2.5 mg by mouth every morning. 08/06/19  Yes [provider]  Ascorbic Acid (VITAMIN C PO) Take 1 tablet by mouth daily.   Yes [provider]  gabapentin (NEURONTIN) 600 MG tablet Take 600 mg by mouth 3 (three) times daily. 06/22/19  Yes [provider]  HYDROcodone-acetaminophen (NORCO/VICODIN) 5-325 MG tablet Take 1 tablet by mouth 3 (three) times daily.   Yes [provider]  ibuprofen (ADVIL) 200 MG tablet Take 800 mg by mouth every 6 (six) hours as needed for fever.   Yes [provider]  lisinopril (PRINIVIL,ZESTRIL) 30 MG tablet Take 30 mg by mouth 3 (three) times daily.  09/09/15  Yes [provider]  Multiple Vitamins-Minerals (ZINC PO) Take 1 tablet by mouth daily.   Yes [provider]  predniSONE (DELTASONE) 20 MG tablet Take 3 tablets (60 mg total) by mouth daily for 4 days. 02/18/20 02/22/20  Carolle Ishii, DO  zolpidem (AMBIEN) 5 MG tablet Take 1 tablet (5 mg total) by mouth at bedtime as needed for sleep. Patient not taking: No sig reported 10/17/14 02/09/20  Dalia Heading, PA-C    Allergies    Tramadol and Codeine  Review of Systems   Review of Systems  Constitutional: Positive for fever. Negative for chills.  HENT: Negative for ear pain and sore throat.   Eyes: Negative for pain and visual disturbance.  Respiratory: Positive for cough, shortness of breath and wheezing. Negative for sputum production.   Cardiovascular: Negative for chest pain, palpitations and claudication.  Gastrointestinal: Negative for abdominal pain and vomiting.  Genitourinary: Negative for dysuria and hematuria.  Musculoskeletal: Negative for arthralgias and back pain.  Skin: Negative for color change and rash.  Neurological: Negative for seizures and syncope.  All other systems reviewed and are negative.   Physical Exam Updated Vital Signs  ED Triage Vitals  Enc Vitals Group     BP 02/18/20 0924 128/89     Pulse Rate 02/18/20 0924 95     Resp 02/18/20 0924 (!) 26     Temp 02/18/20 0924 98.4 F (36.9 C)     Temp Source 02/18/20 0924 Oral     SpO2 02/18/20 0924 100 %     Weight 02/18/20 0924 176 lb (79.8 kg)     Height 02/18/20 0924 5\' 6"  (1.676 m)     Head Circumference --      Peak Flow --      Pain Score 02/18/20 0930 0     Pain Loc --      Pain Edu? --      Excl. in [place]? --     Physical Exam Vitals and nursing note reviewed.  Constitutional:      General: He is not in acute distress.    Appearance: He is well-developed and well-nourished. He is not ill-appearing.  HENT:     Head: Normocephalic and atraumatic.  Eyes:      Conjunctiva/sclera: Conjunctivae normal.     Pupils: Pupils are equal, round, and reactive to light.  Cardiovascular:     Rate and Rhythm: Normal rate and regular rhythm.     Pulses: Normal pulses.     Heart sounds: Normal heart sounds. No murmur heard.   Pulmonary:     Effort: Pulmonary effort is normal. No respiratory distress.     Breath sounds: Wheezing present. No rhonchi or rales.  Abdominal:     Palpations: Abdomen is soft.  Tenderness: There is no abdominal tenderness.  Musculoskeletal:        General: No edema. Normal range of motion.     Cervical back: Normal range of motion and neck supple.     Right lower leg: No edema.     Left lower leg: No edema.  Skin:    General: Skin is warm and dry.     Capillary Refill: Capillary refill takes less than 2 seconds.  Neurological:     General: No focal deficit present.     Mental Status: He is alert.  Psychiatric:        Mood and Affect: Mood and affect and mood normal.     ED Results / Procedures / Treatments   Labs (all labs ordered are listed, but only abnormal results are displayed) Labs Reviewed  BASIC METABOLIC PANEL - Abnormal; Notable for the following components:      Result Value   Glucose, Bld 101 (*)    BUN 5 (*)    Calcium 8.6 (*)    All other components within normal limits  CBC - Abnormal; Notable for the following components:   WBC 11.0 (*)    All other components within normal limits  RESP PANEL BY RT-PCR (FLU A&B, COVID) [id number]    EKG EKG Interpretation  Date/Time:  Friday February 18 2020 09:15:58 EST Ventricular Rate:  97 PR Interval:  122 QRS Duration: 78 QT Interval:  324 QTC Calculation: 411 R Axis:   0 Text Interpretation: Normal sinus rhythm Confirmed by Lennice Sites [id number]) on 02/18/2020 9:35:27 AM   Radiology DG Chest Portable 1 View  Result Date: 02/18/2020 CLINICAL DATA:  Short of breath for several weeks EXAM: PORTABLE CHEST 1 VIEW COMPARISON:  None. FINDINGS: Normal  mediastinum and cardiac silhouette. Chronic central bronchitic markings. Normal pulmonary vasculature. No effusion, infiltrate, or pneumothorax. IMPRESSION: Chronic bronchitic markings. No acute findings. Electronically Signed   By: Suzy Bouchard M.D.   On: 02/18/2020 09:58    Procedures Procedures (including critical care time)  Medications Ordered in ED Medications  albuterol (VENTOLIN HFA) 108 (90 Base) MCG/ACT inhaler 8 puff (8 puffs Inhalation Given 02/18/20 1011)  predniSONE (DELTASONE) tablet 60 mg (60 mg Oral Given 02/18/20 1011)  ipratropium-albuterol (DUONEB) 0.5-2.5 (3) MG/3ML nebulizer solution 3 mL (3 mLs Nebulization Given 02/18/20 1115)    ED Course  I have reviewed the triage vital signs and the nursing notes.  Pertinent labs & imaging results that were available during my care of the patient were reviewed by me and considered in my medical decision making (see chart for details).    MDM Rules/Calculators/A&P                          Lance Parsons is a 64 year old male with history of asthma who presents the ED with shortness of breath, wheezing.  Normal vitals.  No fever.  Flulike symptoms over the last several days.  Other family members with the same symptoms.  Has been using inhaler with improvement.  Had asthma type symptoms over a week ago as well that improved after steroids.  States that he has had a fever on and off for the last several days.  States that symptoms started after being exposed to spray paint.  Had Covid back in August but is also vaccinated but did not get booster.  No known Covid test being positive in the household at this time.  Has wheezing on exam  but overall appears comfortable.  Is able to ambulate in the room with normal oxygen levels in the mid 90s.  Oxygenation normal also at rest.  Will give breathing treatment, prednisone, check basic labs, get a chest x-ray and a Covid test.  Suspect viral process such as Covid or influenza causing  asthma exacerbation.  Possibly pneumonia.  Covid testing and influenza testing is negative.  Chest x-ray without any signs of infection.  No significant anemia, electrolyte abnormality, kidney injury.  Overall suspect asthma exacerbation.  Feeling better after breathing treatment.  Better air movement but still has a fair amount of wheezing.  Will give additional DuoNeb treatment and anticipate discharge to home with steroids.  Patient feeling much better after DuoNeb.  Understands return precautions.  Doubt cardiac process as no chest pain.  Reassuring EKG.  No concern for ACS.  No concern for PE given no hypoxia and likely that this is asthma exacerbation.  Written for steroids.  Has new inhaler.  This chart was dictated using voice recognition software.  Despite best efforts to proofread,  errors can occur which can change the documentation meaning.    Final Clinical Impression(s) / ED Diagnoses Final diagnoses:  Mild asthma with exacerbation, unspecified whether persistent    Rx / DC Orders ED Discharge Orders         Ordered    predniSONE (DELTASONE) 20 MG tablet  Daily        02/18/20 [place], [place], DO 02/18/20 1159

## 2020-02-18 NOTE — Discharge Instructions (Addendum)
Continue to give yourself 4 to 6 puffs of albuterol every 4 hours scheduled for the next 24 hours and then as needed after that.  Take next dose of your steroid tomorrow.  Return to the emergency department if symptoms worsen as discussed.

## 2020-03-03 ENCOUNTER — Other Ambulatory Visit
Admission: RE | Admit: 2020-03-03 | Discharge: 2020-03-03 | Disposition: A | Discharge status: Home / Self Care | Payer: Managed Care, Other (non HMO) | Source: Ambulatory Visit | Attending: Gastroenterology | Admitting: Gastroenterology

## 2020-03-03 ENCOUNTER — Other Ambulatory Visit: Payer: Self-pay

## 2020-03-03 DIAGNOSIS — Z01812 Encounter for preprocedural laboratory examination: Secondary | ICD-10-CM | POA: Insufficient documentation

## 2020-03-03 DIAGNOSIS — Z20822 Contact with and (suspected) exposure to covid-19: Secondary | ICD-10-CM | POA: Diagnosis not present

## 2020-03-04 LAB — SARS CORONAVIRUS 2 (TAT 6-24 HRS): SARS Coronavirus 2: NEGATIVE

## 2020-03-07 ENCOUNTER — Ambulatory Visit
Admission: RE | Admit: 2020-03-07 | Discharge: 2020-03-07 | Disposition: A | Discharge status: Home / Self Care | Payer: Managed Care, Other (non HMO) | Source: Ambulatory Visit | Attending: Gastroenterology | Admitting: Gastroenterology

## 2020-03-07 ENCOUNTER — Ambulatory Visit: Payer: Managed Care, Other (non HMO) | Admitting: Anesthesiology

## 2020-03-07 ENCOUNTER — Other Ambulatory Visit: Payer: Self-pay

## 2020-03-07 ENCOUNTER — Encounter
Admission: RE | Disposition: A | Discharge status: Home / Self Care | Payer: Self-pay | Source: Ambulatory Visit | Attending: Gastroenterology

## 2020-03-07 ENCOUNTER — Encounter: Payer: Self-pay | Admitting: Gastroenterology

## 2020-03-07 DIAGNOSIS — Z803 Family history of malignant neoplasm of breast: Secondary | ICD-10-CM | POA: Insufficient documentation

## 2020-03-07 DIAGNOSIS — Z800 Family history of malignant neoplasm of digestive organs: Secondary | ICD-10-CM | POA: Diagnosis not present

## 2020-03-07 DIAGNOSIS — Z7951 Long term (current) use of inhaled steroids: Secondary | ICD-10-CM | POA: Diagnosis not present

## 2020-03-07 DIAGNOSIS — K5730 Diverticulosis of large intestine without perforation or abscess without bleeding: Secondary | ICD-10-CM | POA: Diagnosis not present

## 2020-03-07 DIAGNOSIS — Z86010 Personal history of colonic polyps: Secondary | ICD-10-CM | POA: Diagnosis not present

## 2020-03-07 DIAGNOSIS — K635 Polyp of colon: Secondary | ICD-10-CM

## 2020-03-07 DIAGNOSIS — Z8371 Family history of colonic polyps: Secondary | ICD-10-CM | POA: Diagnosis not present

## 2020-03-07 DIAGNOSIS — D123 Benign neoplasm of transverse colon: Secondary | ICD-10-CM | POA: Diagnosis not present

## 2020-03-07 DIAGNOSIS — Z8589 Personal history of malignant neoplasm of other organs and systems: Secondary | ICD-10-CM | POA: Insufficient documentation

## 2020-03-07 DIAGNOSIS — Z79899 Other long term (current) drug therapy: Secondary | ICD-10-CM | POA: Insufficient documentation

## 2020-03-07 DIAGNOSIS — Z885 Allergy status to narcotic agent status: Secondary | ICD-10-CM | POA: Diagnosis not present

## 2020-03-07 DIAGNOSIS — D124 Benign neoplasm of descending colon: Secondary | ICD-10-CM | POA: Insufficient documentation

## 2020-03-07 DIAGNOSIS — Z1211 Encounter for screening for malignant neoplasm of colon: Secondary | ICD-10-CM | POA: Insufficient documentation

## 2020-03-07 DIAGNOSIS — D120 Benign neoplasm of cecum: Secondary | ICD-10-CM | POA: Diagnosis not present

## 2020-03-07 DIAGNOSIS — D122 Benign neoplasm of ascending colon: Secondary | ICD-10-CM | POA: Diagnosis not present

## 2020-03-07 DIAGNOSIS — Z87891 Personal history of nicotine dependence: Secondary | ICD-10-CM | POA: Insufficient documentation

## 2020-03-07 HISTORY — PX: COLONOSCOPY WITH PROPOFOL: SHX5780

## 2020-03-07 SURGERY — COLONOSCOPY WITH PROPOFOL
Anesthesia: General

## 2020-03-07 MED ORDER — PROPOFOL 10 MG/ML IV BOLUS
INTRAVENOUS | Status: DC | PRN
  Administered 2020-03-07: 80 mg via INTRAVENOUS

## 2020-03-07 MED ORDER — SODIUM CHLORIDE 0.9 % IV SOLN: INTRAVENOUS | Status: DC

## 2020-03-07 MED ORDER — PROPOFOL 500 MG/50ML IV EMUL
INTRAVENOUS | Status: DC | PRN
  Administered 2020-03-07: 140 ug/kg/min via INTRAVENOUS

## 2020-03-07 MED ORDER — LIDOCAINE HCL (CARDIAC) PF 100 MG/5ML IV SOSY
PREFILLED_SYRINGE | INTRAVENOUS | Status: DC | PRN
  Administered 2020-03-07: 80 mg via INTRAVENOUS

## 2020-03-07 NOTE — Op Note (Signed)
[facility] Gastroenterology Patient Name: Lance Parsons Procedure Date: 03/07/2020 8:25 AM MRN: [id number] Account #: [id number] Date of Birth: 10/11/1955 Admit Type: Outpatient Age: 65 Room: [facility] ENDO ROOM 2 Gender: Male Note Status: Finalized Procedure:             Colonoscopy Indications:           Surveillance: Personal history of piecemeal removal of                         adenoma on last colonoscopy 6 months ago Providers:             Jonathon Bellows MD, MD Referring MD:          Leona Carry. Hall Busing, MD (Referring MD) Medicines:             Monitored Anesthesia Care Complications:         No immediate complications. Procedure:             Pre-Anesthesia Assessment:                        - Prior to the procedure, a History and Physical was                         performed, and patient medications, allergies and                         sensitivities were reviewed. The patient's tolerance                         of previous anesthesia was reviewed.                        - The risks and benefits of the procedure and the                         sedation options and risks were discussed with the                         patient. All questions were answered and informed                         consent was obtained.                        - ASA Grade Assessment: II - A patient with mild                         systemic disease.                        After obtaining informed consent, the colonoscope was                         passed under direct vision. Throughout the procedure,                         the patient's blood pressure, pulse, and oxygen                         saturations were  monitored continuously. The                         Colonoscope was introduced through the anus and                         advanced to the the cecum, identified by the                         appendiceal orifice. The colonoscopy was performed                         with ease. The  patient tolerated the procedure well.                         The quality of the bowel preparation was excellent. Findings:      The perianal and digital rectal examinations were normal.      Four sessile polyps were found in the cecum. The polyps were 4 to 8 mm       in size. These polyps were removed with a cold snare. Resection and       retrieval were complete.      Three sessile polyps were found in the descending colon, transverse       colon and ascending colon. The polyps were 4 to 5 mm in size. These       polyps were removed with a cold snare. Resection and retrieval were       complete.      Multiple small-mouthed diverticula were found in the sigmoid colon.      The exam was otherwise without abnormality on direct and retroflexion       views. Impression:            - Four 4 to 8 mm polyps in the cecum, removed with a                         cold snare. Resected and retrieved.                        - Three 4 to 5 mm polyps in the descending colon, in                         the transverse colon and in the ascending colon,                         removed with a cold snare. Resected and retrieved.                        - Diverticulosis in the sigmoid colon.                        - The examination was otherwise normal on direct and                         retroflexion views. Recommendation:        - Discharge patient to home (with escort).                        - Resume previous diet.                        -  Continue present medications.                        - Await pathology results.                        - Repeat colonoscopy in 1 year for surveillance. Procedure Code(s):     --- Professional ---                        [phone], Colonoscopy, flexible; with removal of                         tumor(s), polyp(s), or other lesion(s) by snare                         technique Diagnosis Code(s):     --- Professional ---                        K63.5, Polyp of colon                         Z09, Encounter for follow-up examination after                         completed treatment for conditions other than                         malignant neoplasm                        Z86.010, Personal history of colonic polyps                        K57.30, Diverticulosis of large intestine without                         perforation or abscess without bleeding CPT copyright 2019 American Medical Association. All rights reserved. The codes documented in this report are preliminary and upon coder review may  be revised to meet current compliance requirements. Jonathon Bellows, MD Jonathon Bellows MD, MD 03/07/2020 8:56:06 AM This report has been signed electronically. Number of Addenda: 0 Note Initiated On: 03/07/2020 8:25 AM Scope Withdrawal Time: 0 hours 12 minutes 55 seconds  Total Procedure Duration: 0 hours 14 minutes 56 seconds  Estimated Blood Loss:  Estimated blood loss: none.      [facility]

## 2020-03-07 NOTE — Anesthesia Preprocedure Evaluation (Signed)
Anesthesia Evaluation  Patient identified by MRN, date of birth, ID band Patient awake    Reviewed: Allergy & Precautions, NPO status , Patient's Chart, lab work & pertinent test results  History of Anesthesia Complications Negative for: history of anesthetic complications  Airway Mallampati: III       Dental   Pulmonary asthma (no inhlaers x 4 days) , neg sleep apnea, neg COPD, Not current smoker, former smoker,           Cardiovascular hypertension, Pt. on medications (-) Past MI and (-) CHF (-) dysrhythmias (-) Valvular Problems/Murmurs     Neuro/Psych neg Seizures Anxiety    GI/Hepatic Neg liver ROS, neg GERD  ,  Endo/Other  neg diabetes  Renal/GU negative Renal ROS     Musculoskeletal   Abdominal   Peds  Hematology   Anesthesia Other Findings   Reproductive/Obstetrics                             Anesthesia Physical Anesthesia Plan  ASA: II  Anesthesia Plan: General   Post-op Pain Management:    Induction: Intravenous  PONV Risk Score and Plan: 2 and Propofol infusion and TIVA  Airway Management Planned: Nasal Cannula  Additional Equipment:   Intra-op Plan:   Post-operative Plan:   Informed Consent: I have reviewed the patients History and Physical, chart, labs and discussed the procedure including the risks, benefits and alternatives for the proposed anesthesia with the patient or authorized representative who has indicated his/her understanding and acceptance.       Plan Discussed with:   Anesthesia Plan Comments:         Anesthesia Quick Evaluation

## 2020-03-07 NOTE — Transfer of Care (Signed)
Immediate Anesthesia Transfer of Care Note  Patient: Lance Parsons  Procedure(s) Performed: COLONOSCOPY WITH PROPOFOL (N/A )  Patient Location: PACU  Anesthesia Type:General  Level of Consciousness: sedated  Airway & Oxygen Therapy: Patient Spontanous Breathing  Post-op Assessment: Report given to RN and Post -op Vital signs reviewed and stable  Post vital signs: Reviewed and stable  Last Vitals:  Vitals Value Taken Time  BP 97/64 03/07/20 0855  Temp 35.8 C 03/07/20 0855  Pulse 76 03/07/20 0855  Resp 25 03/07/20 0855  SpO2 96 % 03/07/20 0855  Vitals shown include unvalidated device data.  Last Pain:  Vitals:   03/07/20 0855  TempSrc: Temporal  PainSc: 0-No pain         Complications: No complications documented.

## 2020-03-07 NOTE — H&P (Signed)
Lance Bellows, MD [address] [place], [place], [place], [address] [address], [place], [place], [place], [address] Phone: [phone]  Fax: [phone]  Primary Care Physician:  Albina Billet, MD   Pre-Procedure History & Physical: HPI:  Lance Parsons is a 65 y.o. male is here for an colonoscopy.   Past Medical History:  Diagnosis Date  . Arthritis 2011  . Asthma 1967  . 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   . Hypertension 2005  . Personal history of colonic polyps     Past Surgical History:  Procedure Laterality Date  . CHOLECYSTECTOMY    . COLONOSCOPY  2006  . COLONOSCOPY WITH PROPOFOL N/A 09/13/2019   Procedure: COLONOSCOPY WITH PROPOFOL;  Surgeon: Lance Bellows, MD;  Location: [facility] ENDOSCOPY;  Service: Endoscopy;  Laterality: N/A;  . KNEE SURGERY Left 2005  . NECK SURGERY     squamous cell carcinoma removal   . TONSILLECTOMY  04-26-13   and adenoids    Prior to Admission medications   Medication Sig Start Date End Date Taking? Authorizing Provider  amLODipine (NORVASC) 2.5 MG tablet Take 2.5 mg by mouth every morning. 08/06/19  Yes [provider]  Ascorbic Acid (VITAMIN C PO) Take 1 tablet by mouth daily.   Yes [provider]  gabapentin (NEURONTIN) 600 MG tablet Take 600 mg by mouth 3 (three) times daily. 06/22/19  Yes [provider]  HYDROcodone-acetaminophen (NORCO/VICODIN) 5-325 MG tablet Take 1 tablet by mouth 3 (three) times daily.   Yes [provider]  lisinopril (PRINIVIL,ZESTRIL) 30 MG tablet Take 30 mg by mouth 3 (three) times daily. 09/09/15  Yes [provider]  Multiple Vitamins-Minerals (ZINC PO) Take 1 tablet by mouth daily.   Yes [provider]  albuterol (PROVENTIL) (2.5 MG/3ML) 0.083% nebulizer solution Take 3 mLs (2.5 mg total) by nebulization every 6 (six) hours as needed for wheezing or shortness of breath. Patient  taking differently: Take 2.5 mg by nebulization every 4 (four) hours as needed for wheezing or shortness of breath. 02/17/20   Melynda Ripple, MD  albuterol (VENTOLIN HFA) 108 (90 Base) MCG/ACT inhaler Inhale 2 puffs into the lungs every 6 (six) hours as needed for wheezing or shortness of breath. Patient taking differently: Inhale 2 puffs into the lungs every 4 (four) hours as needed for wheezing or shortness of breath. 02/17/20   Melynda Ripple, MD  ALPRAZolam Duanne Moron) 1 MG tablet Take 0.5 mg by mouth 3 (three) times daily as needed for anxiety. 08/09/19   [provider]  ibuprofen (ADVIL) 200 MG tablet Take 800 mg by mouth every 6 (six) hours as needed for fever.    [provider]  zolpidem (AMBIEN) 5 MG tablet Take 1 tablet (5 mg total) by mouth at bedtime as needed for sleep. Patient not taking: No sig reported 10/17/14 02/09/20  Dalia Heading, PA-C    Allergies as of 02/14/2020 - Review Complete 02/14/2020  Allergen Reaction Noted  . Tramadol Nausea Only 05/18/2014  . Codeine Swelling and Rash 09/10/2012    Family History  Problem Relation Age of Onset  . Colon polyps Brother   . Breast cancer Maternal Aunt   . Throat cancer Maternal Uncle   . Colon cancer Father     Social History   Socioeconomic History  . Marital status: Married    Spouse name: Not on file  . Number of children: Not on file  .  Years of education: Not on file  . Highest education level: Not on file  Occupational History  . Not on file  Tobacco Use  . Smoking status: Former Smoker    Packs/day: 1.00    Years: 7.00    Pack years: 7.00  . Smokeless tobacco: Never Used  Vaping Use  . Vaping Use: Never used  Substance and Sexual Activity  . Alcohol use: Yes    Comment: occ  . Drug use: Yes    Types: Marijuana  . Sexual activity: Not on file  Other Topics Concern  . Not on file  Social History Narrative  . Not on file   Social Determinants of Health   Financial  Resource Strain: Not on file  Food Insecurity: Not on file  Transportation Needs: Not on file  Physical Activity: Not on file  Stress: Not on file  Social Connections: Not on file  Intimate Partner Violence: Not on file    Review of Systems: See HPI, otherwise negative ROS  Physical Exam: BP 114/80   Pulse 80   Temp (!) 96.9 F (36.1 C) (Temporal)   Resp 16   Ht 5\' 6"  (1.676 m)   Wt 78.5 kg   SpO2 99%   BMI 27.92 kg/m  General:   Alert,  pleasant and cooperative in NAD Head:  Normocephalic and atraumatic. Neck:  Supple; no masses or thyromegaly. Lungs:  Clear throughout to auscultation, normal respiratory effort.    Heart:  +S1, +S2, Regular rate and rhythm, No edema. Abdomen:  Soft, nontender and nondistended. Normal bowel sounds, without guarding, and without rebound.   Neurologic:  Alert and  oriented x4;  grossly normal neurologically.  Impression/Plan: Lance Parsons is here for an colonoscopy to be performed for surveillance due to prior history of colon polyps   Risks, benefits, limitations, and alternatives regarding  colonoscopy have been reviewed with the patient.  Questions have been answered.  All parties agreeable.   Lance Bellows, MD  03/07/2020, 8:18 AM

## 2020-03-07 NOTE — Anesthesia Postprocedure Evaluation (Signed)
Anesthesia Post Note  Patient: Lance Parsons  Procedure(s) Performed: COLONOSCOPY WITH PROPOFOL (N/A )  Patient location during evaluation: Endoscopy Anesthesia Type: General Level of consciousness: awake and alert Pain management: pain level controlled Vital Signs Assessment: post-procedure vital signs reviewed and stable Respiratory status: spontaneous breathing and respiratory function stable Cardiovascular status: stable Anesthetic complications: no   No complications documented.   Last Vitals:  Vitals:   03/07/20 0915 03/07/20 0925  BP: 127/88 (!) 126/96  Pulse: 73 75  Resp: 20 11  Temp:    SpO2: 99% 98%    Last Pain:  Vitals:   03/07/20 0925  TempSrc:   PainSc: 0-No pain                 KEPHART,WILLIAM K

## 2020-03-08 ENCOUNTER — Encounter: Payer: Self-pay | Admitting: Gastroenterology

## 2020-03-08 LAB — SURGICAL PATHOLOGY

## 2020-03-13 ENCOUNTER — Encounter: Payer: Self-pay | Admitting: Gastroenterology

## 2020-03-14 ENCOUNTER — Telehealth: Payer: Self-pay

## 2020-03-14 DIAGNOSIS — Z86010 Personal history of colonic polyps: Secondary | ICD-10-CM

## 2020-03-14 NOTE — Telephone Encounter (Signed)
-----   Message from Jonathon Bellows, MD sent at 03/13/2020 11:12 AM EST ----- Inform that all 7 polyps were precancerous.  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  Recommend  1.  Refer to genetic testing at the cancer center as he has had a cumulative number of over 10 tubular adenomas.  This will help determine if he has any underlying genetic condition that makes him predisposed to getting multiple polyps.  This sometimes is associated with other cancers and can be transferred to his kids.  Recommend repeat colonoscopy in 1 year   Dr Jonathon Bellows MD,MRCP [facility]) Gastroenterology/Hepatology Pager: [phone]

## 2020-03-14 NOTE — Telephone Encounter (Signed)
Place referral.  Patient verbalized understanding of results. He states he will make appointment when they call.  Put in recall for 1 year

## 2020-03-15 ENCOUNTER — Telehealth: Payer: Self-pay | Admitting: Genetic Counselor

## 2020-03-15 NOTE — Telephone Encounter (Signed)
Received a genetic counseling referral from Dr. Vicente Males at [facility]. Lance Parsons returned my call and has been scheduled to see [name] via mychart video on 1/31 at 9am. I verified the pt has an active mychart acct.

## 2020-03-27 ENCOUNTER — Ambulatory Visit (HOSPITAL_BASED_OUTPATIENT_CLINIC_OR_DEPARTMENT_OTHER): Payer: Managed Care, Other (non HMO) | Admitting: Licensed Clinical Social Worker

## 2020-03-27 ENCOUNTER — Encounter: Payer: Self-pay | Admitting: Licensed Clinical Social Worker

## 2020-03-27 DIAGNOSIS — Z8051 Family history of malignant neoplasm of kidney: Secondary | ICD-10-CM

## 2020-03-27 DIAGNOSIS — Z860100 Personal history of colon polyps, unspecified: Secondary | ICD-10-CM | POA: Insufficient documentation

## 2020-03-27 DIAGNOSIS — Z808 Family history of malignant neoplasm of other organs or systems: Secondary | ICD-10-CM | POA: Diagnosis not present

## 2020-03-27 DIAGNOSIS — Z800 Family history of malignant neoplasm of digestive organs: Secondary | ICD-10-CM | POA: Insufficient documentation

## 2020-03-27 DIAGNOSIS — Z803 Family history of malignant neoplasm of breast: Secondary | ICD-10-CM

## 2020-03-27 NOTE — Progress Notes (Signed)
REFERRING PROVIDER: Jonathon Bellows, MD [address] [place],  [place] [address]  PRIMARY PROVIDER:  Albina Billet, MD  PRIMARY REASON FOR VISIT:  1. History of colon polyps   2. Family history of stomach cancer   3. Family history of breast cancer   4. Family history of brain cancer   5. Family history of kidney cancer     I connected with Mr. Faux on 03/27/2020 at 8:45 AM EDT by [place] video conference and verified that I am speaking with the correct person using two identifiers.    Patient location: home Provider location: [place]:   Mr. Boomershine, a 65 y.o. male, was seen for a [facility] cancer genetics consultation at the request of Dr. Vicente Males due to a personal history of polyps and family history of cancer.  Mr. Nauta presents to clinic today to discuss the possibility of a hereditary predisposition to cancer, genetic testing, and to further clarify his future cancer risks, as well as potential cancer risks for family members.   In 2015, at the age of 61, Mr. Raby was diagnosed with squamous cell carcinoma on his neck and had surgery to remove this. He follows up with his oncologist for this.   In 2018, Mr. Holzheimer had a colonoscopy that identified approximately 3 polyps, patient reports. In 2021, Mr. Cochrane had a colonoscopy that identified 11 polyps, a mix of sessile serrated and tubular adenomas. Six months later, in 2022, he had another colonoscopy revealing 7 more polyps, mix of sessile serrated and tubular adenomas.    CANCER HISTORY:  Oncology History   No history exists.    Past Medical History:  Diagnosis Date  . Arthritis 2011  . Asthma 1967  . 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   . Family history of brain cancer   . Family history of breast cancer   . Family history of kidney cancer   . Family history of stomach cancer   .  History of colon polyps   . Hypertension 2005  . Personal history of colonic polyps     Past Surgical History:  Procedure Laterality Date  . CHOLECYSTECTOMY    . COLONOSCOPY  2006  . COLONOSCOPY WITH PROPOFOL N/A 09/13/2019   Procedure: COLONOSCOPY WITH PROPOFOL;  Surgeon: Jonathon Bellows, MD;  Location: [facility] ENDOSCOPY;  Service: Endoscopy;  Laterality: N/A;  . COLONOSCOPY WITH PROPOFOL N/A 03/07/2020   Procedure: COLONOSCOPY WITH PROPOFOL;  Surgeon: Jonathon Bellows, MD;  Location: [facility] ENDOSCOPY;  Service: Gastroenterology;  Laterality: N/A;  . KNEE SURGERY Left 2005  . NECK SURGERY     squamous cell carcinoma removal   . TONSILLECTOMY  04-26-13   and adenoids    Social History   Socioeconomic History  . Marital status: Married    Spouse name: Not on file  . Number of children: Not on file  . Years of education: Not on file  . Highest education level: Not on file  Occupational History  . Not on file  Tobacco Use  . Smoking status: Former Smoker    Packs/day: 1.00    Years: 7.00    Pack years: 7.00  . Smokeless tobacco: Never Used  Vaping Use  . Vaping Use: Never used  Substance and Sexual Activity  . Alcohol use: Yes    Comment: occ  . Drug use: Yes    Types:  Marijuana  . Sexual activity: Not on file  Other Topics Concern  . Not on file  Social History Narrative  . Not on file   Social Determinants of Health   Financial Resource Strain: Not on file  Food Insecurity: Not on file  Transportation Needs: Not on file  Physical Activity: Not on file  Stress: Not on file  Social Connections: Not on file     FAMILY HISTORY:  We obtained a detailed, 4-generation family history.  Significant diagnoses are listed below: Family History  Problem Relation Age of Onset  . Colon polyps Brother   . Breast cancer Maternal Aunt 38  . Throat cancer Maternal Uncle        dx 70s  . Colon cancer Father        or stomach cancer  . Bone cancer Mother   . Kidney cancer Daughter         dx 65s  . Brain cancer Cousin    Mr. Kumari has 2 sons and a daughter. His daughter had kidney cancer in her 48s and had her kidney removed. Patient has 2 brothers, 1 full sister, and 2 maternal half-sisters, none have had cancer.   Mr. Mcgillis's mother died of bone cancer at 103. Patient had 3 maternal uncles, 3 maternal aunts. An aunt was diagnosed with breast cancer in her late 42s and died in her 38s. An uncle was diagnosed with throat cancer and passed in his 37s, he did not have history of smoking. A maternal cousin had brain cancer at 50 and is living in her 78s. Maternal grandmother died of Alzheimer's in her 68s and grandfather passed possibly in his 50s, no cancer that the patient is aware of.   Mr. Bhullar's father had either stomach or colon cancer and died in his 44s. Patient does not have contact with this side of the family so he had limited information; no cancers that he is aware of.   Mr. Ingwersen is unaware of previous family history of genetic testing for hereditary cancer risks. Patient's maternal ancestors are of [place], Native American descent, and paternal ancestors are of Spanish descent. There is no reported Ashkenazi Jewish ancestry. There is no known consanguinity.    GENETIC COUNSELING ASSESSMENT: Mr. Harlin is a 65 y.o. male with a personal history of polyps and family history of cancer which is somewhat suggestive of a hereditary cancer syndrome and predisposition to cancer. We, therefore, discussed and recommended the following at today's visit.   DISCUSSION: We discussed that polyps in general are common, however, most people have fewer than 5 lifetime polyps.  When an individual has 10 or more polyps we become concerned about an underlying polyposis syndrome.  Given the family history of breast and other cancers, we also discussed that approximately 5-10% of  cancer is hereditary, and that the genes we will look at are associated with many different types  of cancer including some we see in his family such as breast, kidney and brain. We discussed that testing is beneficial for several reasons including knowing about other cancer risks, identifying potential screening and risk-reduction options that may be appropriate, and to understand if other family members could be at risk for cancer and allow them to undergo genetic testing.   We reviewed the characteristics, features and inheritance patterns of hereditary cancer syndromes. We also discussed genetic testing, including the appropriate family members to test, the process of testing, insurance coverage and turn-around-time for results. We discussed the implications  of a negative, positive and/or variant of uncertain significant result. We recommended Mr. Whittaker pursue genetic testing for the [facility] CustomNext gene panel.   The CustomNext-Cancer panel  includes sequencing and/or deletion duplication testing of the following 91 genes: AIP, ALK, APC*, ATM*, AXIN2, BAP1, BARD1, BLM, BMPR1A, BRCA1*, BRCA2*, BRIP1*, CDC73, CDH1*, CDK4, CDKN1B, CDKN2A, CHEK2*, CTNNA1, DICER1, FANCC, FH, FLCN, GALNT12, KIF1B, LZTR1, MAX, MEN1, MET, MLH1*, MRE11A, MSH2*, MSH3, MSH6*, MUTYH*, NBN, NF1*, NF2, NTHL1, PALB2*, PHOX2B, PMS2*, POT1, PRKAR1A, PTCH1, PTEN*, RAD50, RAD51C*, RAD51D*, RB1, RECQL, RET, SDHA, SDHAF2, SDHB, SDHC, SDHD, SMAD4, SMARCA4, SMARCB1, SMARCE1, STK11, SUFU, TMEM127, TP53*, TSC1, TSC2, VHL and XRCC2 (sequencing and deletion/duplication); CASR, CFTR, CPA1, CTRC, EGFR, EGLN1, FAM175A, HOXB13, KIT, MITF, MLH3, PALLD, PDGFRA, POLD1, POLE, PRSS1, RINT1, RPS20, SPINK1 and TERT (sequencing only); EPCAM and GREM1 (deletion/duplication only).   Based on Mr. Deutscher personal and family history, he meets medical criteria for genetic testing. Despite that he meets criteria, he may still have an out of pocket cost. We discussed that if his out of pocket cost for testing is over $100, the laboratory will call and  confirm whether he wants to proceed with testing.  If the out of pocket cost of testing is less than $100 he will be billed by the genetic testing laboratory.     PLAN: After considering the risks, benefits, and limitations, Mr. Martindale provided informed consent to pursue genetic testing. A saliva kit was mailed to him and the sample will be sent to [facility] for analysis of the CustomNext-Cancer panel. Results should be available within approximately 2-3 weeks' time, at which point they will be disclosed by telephone to Mr. Levario, as will any additional recommendations warranted by these results. Mr. Trawick will receive a summary of his genetic counseling visit and a copy of his results once available. This information will also be available in Epic.   Mr. Wicklund's questions were answered to his satisfaction today. Our contact information was provided should additional questions or concerns arise. Thank you for the referral and allowing [place] to share in the care of your patient.   Faith Rogue, MS, [facility] Genetic Counselor [email or web address] Phone: [phone]  The patient was seen for a total of 45 minutes in virtual genetic counseling.  [employer] intern Fredrik Rigger was also present and assisted with this case. Dr. Grayland Ormond was available for discussion regarding this case.   _______________________________________________________________________ For Office Staff:  Number of people involved in session: 2 Was an Intern/ student involved with case: yes

## 2020-04-20 ENCOUNTER — Ambulatory Visit: Payer: Self-pay | Admitting: Licensed Clinical Social Worker

## 2020-04-20 ENCOUNTER — Telehealth: Payer: Self-pay | Admitting: Licensed Clinical Social Worker

## 2020-04-20 ENCOUNTER — Encounter: Payer: Self-pay | Admitting: Licensed Clinical Social Worker

## 2020-04-20 DIAGNOSIS — Z86010 Personal history of colonic polyps: Secondary | ICD-10-CM

## 2020-04-20 DIAGNOSIS — Z803 Family history of malignant neoplasm of breast: Secondary | ICD-10-CM

## 2020-04-20 DIAGNOSIS — Z8051 Family history of malignant neoplasm of kidney: Secondary | ICD-10-CM

## 2020-04-20 DIAGNOSIS — Z1379 Encounter for other screening for genetic and chromosomal anomalies: Secondary | ICD-10-CM | POA: Insufficient documentation

## 2020-04-20 DIAGNOSIS — Z808 Family history of malignant neoplasm of other organs or systems: Secondary | ICD-10-CM

## 2020-04-20 DIAGNOSIS — Z800 Family history of malignant neoplasm of digestive organs: Secondary | ICD-10-CM

## 2020-04-20 NOTE — Telephone Encounter (Signed)
Revealed negative genetic testing.   We discussed that we do not know why he has polyps or why there is cancer in the family. It could be due to a different gene that we are not testing, or something our current technology cannot pick up.  It will be important for him to keep in contact with genetics to learn if additional testing may be needed in the future.  

## 2020-04-20 NOTE — Progress Notes (Signed)
HPI:  [name] was previously seen in the [facility] due to a personal history of polyps and concerns regarding a hereditary predisposition to cancer. Please refer to our prior cancer genetics clinic note for more information regarding our discussion, assessment and recommendations, at the time. [name]'s recent genetic test results were disclosed to him, as were recommendations warranted by these results. These results and recommendations are discussed in more detail below.  In 2018, [name] had a colonoscopy that identified approximately 3 polyps, patient reports. In 2021, 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 Six months later, in 2022, he had another colonoscopy revealing 7 more polyps, mix of sessile serrated and tubular adenomas.   CANCER HISTORY:  Oncology History   No history exists.    FAMILY HISTORY:  We obtained a detailed, 4-generation family history.  Significant diagnoses are listed below: Family History  Problem Relation Age of Onset  . Colon polyps Brother   . Breast cancer Maternal Aunt 38  . Throat cancer Maternal Uncle        dx 60s  . Colon cancer Father        or stomach cancer  . Bone cancer Mother   . Kidney cancer Daughter        dx 30s  . Brain cancer Cousin    [name] has 2 sons and a daughter. His daughter had kidney cancer in her 30s and had her kidney removed. Patient has 2 brothers, 1 full sister, and 2 maternal half-sisters, none have had cancer.   [name]'s mother died of bone cancer at 47 **update, patient learned this was lung cancer metastasized to bone. Patient had 3 maternal uncles, 3 maternal aunts. An aunt was diagnosed with breast cancer in her late 46s and died in her 67s. An uncle was diagnosed with throat cancer and passed in his 86s, he did not have history of smoking. A maternal cousin had brain cancer at 58 and is living in  her 1s. Maternal grandmother died of Alzheimer's in her 23s and grandfather passed possibly in his 4s, no cancer that the patient is aware of.   [name]'s father had either stomach or colon cancer and died in his 39s. Patient does not have contact with this side of the family so he had limited information; no cancers that he is aware of.   [name] is unaware of previous family history of genetic testing for hereditary cancer risks. Patient's maternal ancestors are of [place], Native American descent, and paternal ancestors are of Spanish descent. There is no reported Ashkenazi Jewish ancestry. There is no known consanguinity.      GENETIC TEST RESULTS: Genetic testing reported out on  04/18/2020 through the [employer] cancer panel found no pathogenic mutations.   The [facility]-Cancer panel  includes sequencing and/or deletion duplication testing of the following 91 genes: AIP, ALK, APC*, ATM*, AXIN2, BAP1, BARD1, BLM, BMPR1A, BRCA1*, BRCA2*, BRIP1*, CDC73, CDH1*, CDK4, CDKN1B, CDKN2A, CHEK2*, CTNNA1, DICER1, FANCC, FH, FLCN, GALNT12, KIF1B, LZTR1, MAX, MEN1, MET, MLH1*, MRE11A, MSH2*, MSH3, MSH6*, MUTYH*, NBN, NF1*, NF2, NTHL1, PALB2*, PHOX2B, PMS2*, POT1, PRKAR1A, PTCH1, PTEN*, RAD50, RAD51C*, RAD51D*, RB1, RECQL, RET, SDHA, SDHAF2, SDHB, SDHC, SDHD, SMAD4, SMARCA4, SMARCB1, SMARCE1, STK11, SUFU, TMEM127, TP53*, TSC1, TSC2, VHL and XRCC2 (sequencing and deletion/duplication); CASR, CFTR, CPA1, CTRC, EGFR, EGLN1, FAM175A, HOXB13, KIT, MITF, MLH3, PALLD, PDGFRA, POLD1, POLE, PRSS1, RINT1, RPS20, SPINK1 and TERT (sequencing only);  EPCAM and GREM1 (deletion/duplication only).   The test report has been scanned into EPIC and is located under the Molecular Pathology section of the Results Review tab.  A portion of the result report is included below for reference.     We discussed with [name] that because current genetic testing is not perfect, it is possible there may be a gene  mutation in one of these genes that current testing cannot detect, but that chance is small.  We also discussed, that there could be another gene that has not yet been discovered, or that we have not yet tested, that is responsible for the cancer diagnoses in the family. It is also possible there is a hereditary cause for the cancer in the family that [name] did not inherit and therefore was not identified in his testing.  Therefore, it is important to remain in touch with cancer genetics in the future so that we can continue to offer [name] the most up to date genetic testing.   ADDITIONAL GENETIC TESTING: We discussed with [name] that his genetic testing was fairly extensive.  If there are genes identified to increase cancer risk that can be analyzed in the future, we would be happy to discuss and coordinate this testing at that time.    CANCER SCREENING RECOMMENDATIONS: [name]'s test result is considered negative (normal).  This means that we have not identified a hereditary cause for his  personal history polyps and family history of cancer at this time.   While reassuring, this does not definitively rule out a hereditary predisposition to cancer. It is still possible that there could be genetic mutations that are undetectable by current technology. There could be genetic mutations in genes that have not been tested or identified to increase cancer risk.  Therefore, it is recommended he continue to follow the cancer management and screening guidelines provided by his primary healthcare provider.   An individual's cancer risk and medical management are not determined by genetic test results alone. Overall cancer risk assessment incorporates additional factors, including personal medical history, family history, and any available genetic information that may result in a personalized plan for cancer prevention and surveillance.  This negative genetic test simply tells [place] that  we cannot yet define why [name] has had  an increased number of colorectal polyps.[name]'s medical management and screening should be based on the prospect that he will likely form more colon polyps and should, therefore, undergo more frequent colonoscopy screening at intervals determined by his GI providers.  We also recommended that [name] have an upper endoscopy periodically.  RECOMMENDATIONS FOR FAMILY MEMBERS:  Relatives in this family might be at some increased risk of developing cancer, over the general population risk, simply due to the family history of cancer.  We recommended male relatives in this family have a yearly mammogram beginning at age 50, or 11 years younger than the earliest onset of cancer, an annual clinical breast exam, and perform monthly breast self-exams. Male relatives in this family should also have a gynecological exam as recommended by their primary provider.  All family members should be referred for colonoscopy starting at age 65.    It is also possible there is a hereditary cause for the cancer in [name]'s family that he did not inherit and therefore was not identified in him.  Based on [name]'s family history, we recommended maternal relatives have genetic counseling and testing. [name] will let us  know if we can be of any assistance in coordinating genetic counseling and/or testing for these family members.  FOLLOW-UP: Lastly, we discussed with [name] that cancer genetics is a rapidly advancing field and it is possible that new genetic tests will be appropriate for him and/or his family members in the future. We encouraged him to remain in contact with cancer genetics on an annual basis so we can update his personal and family histories and let him know of advances in cancer genetics that may benefit this family.   Our contact number was provided. [name]'s questions were answered to his satisfaction, and he knows  he is welcome to call us at anytime with additional questions or concerns.   Faith Rogue, MS, [facility] Genetic Counselor [email or web address] Phone: [phone]

## 2021-11-13 ENCOUNTER — Telehealth: Payer: Self-pay | Admitting: Gastroenterology

## 2021-11-13 NOTE — Telephone Encounter (Signed)
Patient calling to schedule repeat colonoscopy. Requests call back.

## 2021-11-14 NOTE — Telephone Encounter (Signed)
Returned patients call.  LVM for him to call me back.  Thanks,  [place], [place]

## 2021-11-19 ENCOUNTER — Other Ambulatory Visit: Payer: Self-pay

## 2021-11-19 ENCOUNTER — Telehealth: Payer: Self-pay

## 2021-11-19 DIAGNOSIS — Z86010 Personal history of colonic polyps: Secondary | ICD-10-CM

## 2021-11-19 MED ORDER — NA SULFATE-K SULFATE-MG SULF 17.5-3.13-1.6 GM/177ML PO SOLN: 1.0000 | Freq: Once | ORAL | 0 refills | Status: AC

## 2021-11-19 NOTE — Telephone Encounter (Signed)
Gastroenterology Pre-Procedure Review  Request Date: 12/04/21 Requesting Physician: Dr. Wilhemena Durie  PATIENT REVIEW QUESTIONS: The patient responded to the following health history questions as indicated:    1. Are you having any GI issues? yes (change in bowel habits, sometimes there is blood present however he said he knows he has internal hemoorhoids) 2. Do you have a personal history of Polyps? yes (03/07/20 performed by Dr. Vicente Males) 3. Do you have a family history of Colon Cancer or Polyps? Father may have had stomach cancer 4. Diabetes Mellitus? no 5. Joint replacements in the past 12 months?no 6. Major health problems in the past 3 months? 7 months ago lipoma removed from shoulder 7. Any artificial heart valves, MVP, or defibrillator?no    MEDICATIONS & ALLERGIES:    Patient reports the following regarding taking any anticoagulation/antiplatelet therapy:   Plavix, Coumadin, Eliquis, Xarelto, Lovenox, Pradaxa, Brilinta, or Effient? no Aspirin? no  Patient confirms/reports the following medications:  Current Outpatient Medications  Medication Sig Dispense Refill   albuterol (PROVENTIL) (2.5 MG/3ML) 0.083% nebulizer solution Take 3 mLs (2.5 mg total) by nebulization every 6 (six) hours as needed for wheezing or shortness of breath. (Patient taking differently: Take 2.5 mg by nebulization every 4 (four) hours as needed for wheezing or shortness of breath.) 60 mL 0   albuterol (VENTOLIN HFA) 108 (90 Base) MCG/ACT inhaler Inhale 2 puffs into the lungs every 6 (six) hours as needed for wheezing or shortness of breath. (Patient taking differently: Inhale 2 puffs into the lungs every 4 (four) hours as needed for wheezing or shortness of breath.) 1 each 0   ALPRAZolam (XANAX) 1 MG tablet Take 0.5 mg by mouth 3 (three) times daily as needed for anxiety.     amLODipine (NORVASC) 2.5 MG tablet Take 2.5 mg by mouth every morning.     Ascorbic Acid (VITAMIN C PO) Take 1 tablet by mouth daily.     gabapentin  (NEURONTIN) 600 MG tablet Take 600 mg by mouth 3 (three) times daily.     HYDROcodone-acetaminophen (NORCO/VICODIN) 5-325 MG tablet Take 1 tablet by mouth 3 (three) times daily.     ibuprofen (ADVIL) 200 MG tablet Take 800 mg by mouth every 6 (six) hours as needed for fever.     lisinopril (PRINIVIL,ZESTRIL) 30 MG tablet Take 30 mg by mouth 3 (three) times daily.     Multiple Vitamins-Minerals (ZINC PO) Take 1 tablet by mouth daily.     No current facility-administered medications for this visit.    Patient confirms/reports the following allergies:  Allergies  Allergen Reactions   Tramadol Nausea Only   Codeine Swelling and Rash    No orders of the defined types were placed in this encounter.   AUTHORIZATION INFORMATION Primary Insurance: 1D#: Group #:  Secondary Insurance: 1D#: Group #:  SCHEDULE INFORMATION: Date: 12/04/21 Time: Location: [facility]

## 2021-12-04 ENCOUNTER — Ambulatory Visit: Payer: Managed Care, Other (non HMO) | Admitting: Anesthesiology

## 2021-12-04 ENCOUNTER — Encounter
Admission: RE | Disposition: A | Discharge status: Home / Self Care | Payer: Self-pay | Source: Home / Self Care | Attending: Gastroenterology

## 2021-12-04 ENCOUNTER — Encounter: Payer: Self-pay | Admitting: Gastroenterology

## 2021-12-04 ENCOUNTER — Ambulatory Visit
Admission: RE | Admit: 2021-12-04 | Discharge: 2021-12-04 | Disposition: A | Discharge status: Home / Self Care | Payer: Managed Care, Other (non HMO) | Attending: Gastroenterology | Admitting: Gastroenterology

## 2021-12-04 ENCOUNTER — Other Ambulatory Visit: Payer: Self-pay

## 2021-12-04 DIAGNOSIS — Z1211 Encounter for screening for malignant neoplasm of colon: Secondary | ICD-10-CM | POA: Insufficient documentation

## 2021-12-04 DIAGNOSIS — D126 Benign neoplasm of colon, unspecified: Secondary | ICD-10-CM | POA: Diagnosis not present

## 2021-12-04 DIAGNOSIS — J45909 Unspecified asthma, uncomplicated: Secondary | ICD-10-CM | POA: Insufficient documentation

## 2021-12-04 DIAGNOSIS — D122 Benign neoplasm of ascending colon: Secondary | ICD-10-CM | POA: Insufficient documentation

## 2021-12-04 DIAGNOSIS — Z860100 Personal history of colon polyps, unspecified: Secondary | ICD-10-CM

## 2021-12-04 DIAGNOSIS — Z9049 Acquired absence of other specified parts of digestive tract: Secondary | ICD-10-CM | POA: Diagnosis not present

## 2021-12-04 DIAGNOSIS — Z85828 Personal history of other malignant neoplasm of skin: Secondary | ICD-10-CM | POA: Insufficient documentation

## 2021-12-04 DIAGNOSIS — Z860101 Personal history of adenomatous and serrated colon polyps: Secondary | ICD-10-CM

## 2021-12-04 DIAGNOSIS — F1290 Cannabis use, unspecified, uncomplicated: Secondary | ICD-10-CM | POA: Diagnosis not present

## 2021-12-04 DIAGNOSIS — I10 Essential (primary) hypertension: Secondary | ICD-10-CM | POA: Diagnosis not present

## 2021-12-04 DIAGNOSIS — Z87891 Personal history of nicotine dependence: Secondary | ICD-10-CM | POA: Diagnosis not present

## 2021-12-04 HISTORY — PX: COLONOSCOPY WITH PROPOFOL: SHX5780

## 2021-12-04 SURGERY — COLONOSCOPY WITH PROPOFOL
Anesthesia: General

## 2021-12-04 MED ORDER — PROPOFOL 10 MG/ML IV BOLUS
INTRAVENOUS | Status: DC | PRN
  Administered 2021-12-04: 70 mg via INTRAVENOUS

## 2021-12-04 MED ORDER — PROPOFOL 500 MG/50ML IV EMUL
INTRAVENOUS | Status: DC | PRN
  Administered 2021-12-04: 200 ug/kg/min via INTRAVENOUS

## 2021-12-04 MED ORDER — SODIUM CHLORIDE 0.9 % IV SOLN: INTRAVENOUS | Status: DC

## 2021-12-04 NOTE — Anesthesia Procedure Notes (Signed)
Date/Time: 12/04/2021 9:47 AM  Performed by: Nelda Marseille, CRNAPre-anesthesia Checklist: Patient identified, Emergency Drugs available, Suction available, Patient being monitored and Timeout performed Oxygen Delivery Method: Nasal cannula

## 2021-12-04 NOTE — Anesthesia Preprocedure Evaluation (Signed)
Anesthesia Evaluation  Patient identified by MRN, date of birth, ID band Patient awake    Reviewed: Allergy & Precautions, NPO status , Patient's Chart, lab work & pertinent test results  History of Anesthesia Complications Negative for: history of anesthetic complications  Airway Mallampati: II  TM Distance: >3 FB Neck ROM: full    Dental  (+) Chipped   Pulmonary asthma , former smoker,    Pulmonary exam normal        Cardiovascular hypertension, negative cardio ROS Normal cardiovascular exam     Neuro/Psych PSYCHIATRIC DISORDERS Anxiety  Neuromuscular disease    GI/Hepatic negative GI ROS, Neg liver ROS,   Endo/Other  negative endocrine ROS  Renal/GU negative Renal ROS  negative genitourinary   Musculoskeletal  (+) Arthritis , Osteoarthritis,    Abdominal   Peds  Hematology negative hematology ROS (+)   Anesthesia Other Findings History of neck cancer, no radiation to the neck   Past Medical History: 2011: Arthritis 1967: Asthma 04-26-13: Cancer ([place])     Comment:  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  No date: Family history of brain cancer No date: Family history of breast cancer No date: Family history of kidney cancer No date: Family history of stomach cancer No date: History of colon polyps 2005: Hypertension No date: Personal history of colonic polyps  Past Surgical History: No date: BACK SURGERY No date: CHOLECYSTECTOMY 2006: COLONOSCOPY 09/13/2019: COLONOSCOPY WITH PROPOFOL; N/A     Comment:  Procedure: COLONOSCOPY WITH PROPOFOL;  Surgeon: Jonathon Bellows, MD;  Location: [facility] ENDOSCOPY;  Service:               Endoscopy;  Laterality: N/A; 03/07/2020: COLONOSCOPY WITH PROPOFOL; N/A     Comment:  Procedure: COLONOSCOPY WITH PROPOFOL;  Surgeon: Jonathon Bellows, MD;  Location: [facility] ENDOSCOPY;  Service:                Gastroenterology;  Laterality: N/A; 2005: KNEE SURGERY; Left No date: LAMINECTOMY     Comment:  c spine No date: NECK SURGERY     Comment:  squamous cell carcinoma removal  04/26/2013: TONSILLECTOMY     Comment:  and adenoids     Reproductive/Obstetrics negative OB ROS                            Anesthesia Physical Anesthesia Plan  ASA: 2  Anesthesia Plan: General   Post-op Pain Management: Minimal or no pain anticipated   Induction: Intravenous  PONV Risk Score and Plan: Propofol infusion and TIVA  Airway Management Planned: Natural Airway and Nasal Cannula  Additional Equipment:   Intra-op Plan:   Post-operative Plan:   Informed Consent: I have reviewed the patients History and Physical, chart, labs and discussed the procedure including the risks, benefits and alternatives for the proposed anesthesia with the patient or authorized representative who has indicated his/her understanding and acceptance.     Dental Advisory Given  Plan Discussed with: Anesthesiologist, CRNA and Surgeon  Anesthesia Plan Comments: (Patient consented for risks of anesthesia including but not limited to:  - adverse reactions to medications - risk of airway placement if required - damage to eyes, teeth, lips or other  oral mucosa - nerve damage due to positioning  - sore throat or hoarseness - Damage to heart, brain, nerves, lungs, other parts of body or loss of life  Patient voiced understanding.)       Anesthesia Quick Evaluation

## 2021-12-04 NOTE — H&P (Signed)
Jonathon Bellows, MD [address] [place], [place], [place], [address] [address], [place], [place], [place], [address] Phone: [phone]  Fax: [phone]  Primary Care Physician:  Albina Billet, MD   Pre-Procedure History & Physical: HPI:  Lance Parsons is a 66 y.o. male is here for an colonoscopy.   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    Family history of brain cancer    Family history of breast cancer    Family history of kidney cancer    Family history of stomach cancer    History of colon polyps    Hypertension 2005   Personal history of colonic polyps     Past Surgical History:  Procedure Laterality Date   BACK SURGERY     CHOLECYSTECTOMY     COLONOSCOPY  2006   COLONOSCOPY WITH PROPOFOL N/A 09/13/2019   Procedure: COLONOSCOPY WITH PROPOFOL;  Surgeon: Jonathon Bellows, MD;  Location: [facility] ENDOSCOPY;  Service: Endoscopy;  Laterality: N/A;   COLONOSCOPY WITH PROPOFOL N/A 03/07/2020   Procedure: COLONOSCOPY WITH PROPOFOL;  Surgeon: Jonathon Bellows, MD;  Location: [facility] ENDOSCOPY;  Service: Gastroenterology;  Laterality: N/A;   KNEE SURGERY Left 2005   LAMINECTOMY     c spine   NECK SURGERY     squamous cell carcinoma removal    TONSILLECTOMY  04/26/2013   and adenoids    Prior to Admission medications   Medication Sig Start Date End Date Taking? Authorizing Provider  albuterol (PROVENTIL) (2.5 MG/3ML) 0.083% nebulizer solution Take 3 mLs (2.5 mg total) by nebulization every 6 (six) hours as needed for wheezing or shortness of breath. Patient taking differently: Take 2.5 mg by nebulization every 4 (four) hours as needed for wheezing or shortness of breath. 02/17/20  Yes Melynda Ripple, MD  albuterol (VENTOLIN HFA) 108 (90 Base) MCG/ACT inhaler Inhale 2 puffs into the lungs every 6 (six) hours as needed for wheezing or shortness  of breath. Patient taking differently: Inhale 2 puffs into the lungs every 4 (four) hours as needed for wheezing or shortness of breath. 02/17/20  Yes Melynda Ripple, MD  ALPRAZolam Duanne Moron) 1 MG tablet Take 0.5 mg by mouth 3 (three) times daily as needed for anxiety. 08/09/19  Yes [provider]  HYDROcodone-acetaminophen (NORCO/VICODIN) 5-325 MG tablet Take 1 tablet by mouth 3 (three) times daily.   Yes [provider]  amLODipine (NORVASC) 2.5 MG tablet Take 2.5 mg by mouth every morning. 08/06/19   [provider]  Ascorbic Acid (VITAMIN C PO) Take 1 tablet by mouth daily.    [provider]  gabapentin (NEURONTIN) 600 MG tablet Take 600 mg by mouth 3 (three) times daily. 06/22/19   [provider]  ibuprofen (ADVIL) 200 MG tablet Take 800 mg by mouth every 6 (six) hours as needed for fever.    [provider]  lisinopril (PRINIVIL,ZESTRIL) 30 MG tablet Take 30 mg by mouth 3 (three) times daily. 09/09/15   [provider]  Multiple Vitamins-Minerals (ZINC PO) Take 1 tablet by mouth daily.    [provider]  zolpidem (AMBIEN) 5 MG tablet Take 1 tablet (5 mg total) by mouth at bedtime as needed for sleep. Patient not taking: No sig reported 10/17/14 02/09/20  Dalia Heading, PA-C    Allergies as of 11/19/2021 - Review Complete 03/07/2020  Allergen  Reaction Noted   Tramadol Nausea Only 05/18/2014   Codeine Swelling and Rash 09/10/2012    Family History  Problem Relation Age of Onset   Colon polyps Brother    Breast cancer Maternal Aunt 38   Throat cancer Maternal Uncle        dx 16s   Colon cancer Father        or stomach cancer   Bone cancer Mother    Kidney cancer Daughter        dx 22s   Brain cancer Cousin     Social History   Socioeconomic History   Marital status: Married    Spouse name: Not on file   Number of children: Not on file   Years of education: Not on file   Highest education level:  Not on file  Occupational History   Not on file  Tobacco Use   Smoking status: Former    Packs/day: 1.00    Years: 7.00    Total pack years: 7.00    Types: Cigarettes    Quit date: 86    Years since quitting: 36.7   Smokeless tobacco: Never  Vaping Use   Vaping Use: Never used  Substance and Sexual Activity   Alcohol use: Not Currently   Drug use: Yes    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 General:   Alert,  pleasant and cooperative in NAD Head:  Normocephalic and atraumatic. Neck:  Supple; no masses or thyromegaly. Lungs:  Clear throughout to auscultation, normal respiratory effort.    Heart:  +S1, +S2, Regular rate and rhythm, No edema. Abdomen:  Soft, nontender and nondistended. Normal bowel sounds, without guarding, and without rebound.   Neurologic:  Alert and  oriented x4;  grossly normal neurologically.  Impression/Plan: Lance Parsons is here for an colonoscopy to be performed for surveillance due to prior history of colon polyps   Risks, benefits, limitations, and alternatives regarding  colonoscopy have been reviewed with the patient.  Questions have been answered.  All parties agreeable.   Jonathon Bellows, MD  12/04/2021, 9:20 AM

## 2021-12-04 NOTE — Transfer of Care (Signed)
Immediate Anesthesia Transfer of Care Note  Patient: Lance Parsons  Procedure(s) Performed: COLONOSCOPY WITH PROPOFOL  Patient Location: PACU  Anesthesia Type:General  Level of Consciousness: sedated  Airway & Oxygen Therapy: Patient Spontanous Breathing and Patient connected to nasal cannula oxygen  Post-op Assessment: Report given to RN and Post -op Vital signs reviewed and stable  Post vital signs: Reviewed and stable  Last Vitals:  Vitals Value Taken Time  BP 110/84 12/04/21 1000  Temp 36.7 C 12/04/21 1000  Pulse 86 12/04/21 1001  Resp 30 12/04/21 1001  SpO2 95 % 12/04/21 1001  Vitals shown include unvalidated device data.  Last Pain:  Vitals:   12/04/21 1000  TempSrc: Tympanic  PainSc: Asleep         Complications: No notable events documented.

## 2021-12-04 NOTE — Op Note (Signed)
[facility] Gastroenterology Patient Name: Lance Parsons Procedure Date: 12/04/2021 9:39 AM MRN: [id number] Account #: [id number] Date of Birth: 03-Jul-1955 Admit Type: Outpatient Age: 66 Room: [facility] ENDO ROOM 2 Gender: Male Note Status: Finalized Instrument Name: Jasper Riling [id number] Procedure:             Colonoscopy Indications:           Surveillance: History of piecemeal removal adenoma on                         last colonoscopy (< 3 yrs) Providers:             Jonathon Bellows MD, MD Referring MD:          Leona Carry. Hall Busing, MD (Referring MD) Medicines:             Monitored Anesthesia Care Complications:         No immediate complications. Procedure:             Pre-Anesthesia Assessment:                        - Prior to the procedure, a History and Physical was                         performed, and patient medications, allergies and                         sensitivities were reviewed. The patient's tolerance                         of previous anesthesia was reviewed.                        - The risks and benefits of the procedure and the                         sedation options and risks were discussed with the                         patient. All questions were answered and informed                         consent was obtained.                        - ASA Grade Assessment: II - A patient with mild                         systemic disease.                        After obtaining informed consent, the colonoscope was                         passed under direct vision. Throughout the procedure,                         the patient's blood pressure, pulse, and oxygen  saturations were monitored continuously. The                         Colonoscope was introduced through the anus and                         advanced to the the cecum, identified by the                         appendiceal orifice. The colonoscopy was performed                          with ease. The patient tolerated the procedure well.                         The quality of the bowel preparation was excellent. Findings:      The perianal and digital rectal examinations were normal.      Five sessile polyps were found in the ascending colon. The polyps were 4       to 7 mm in size. These polyps were removed with a cold snare. Resection       and retrieval were complete.      The exam was otherwise without abnormality on direct and retroflexion       views. Impression:            - Five 4 to 7 mm polyps in the ascending colon,                         removed with a cold snare. Resected and retrieved.                        - The examination was otherwise normal on direct and                         retroflexion views. Recommendation:        - Discharge patient to home (with escort).                        - Resume previous diet.                        - Continue present medications.                        - Await pathology results.                        - Repeat colonoscopy in 3 years for surveillance. Procedure Code(s):     --- Professional ---                        [phone], Colonoscopy, flexible; with removal of                         tumor(s), polyp(s), or other lesion(s) by snare                         technique Diagnosis Code(s):     --- Professional ---  Z86.010, Personal history of colonic polyps                        K63.5, Polyp of colon CPT copyright 2019 [employer]. All rights reserved. The codes documented in this report are preliminary and upon coder review may  be revised to meet current compliance requirements. Jonathon Bellows, MD Jonathon Bellows MD, MD 12/04/2021 9:57:52 AM This report has been signed electronically. Number of Addenda: 0 Note Initiated On: 12/04/2021 9:39 AM Scope Withdrawal Time: 0 hours 9 minutes 12 seconds  Total Procedure Duration: 0 hours 11 minutes 50 seconds  Estimated Blood Loss:   Estimated blood loss: none.      [facility]

## 2021-12-04 NOTE — Anesthesia Postprocedure Evaluation (Signed)
Anesthesia Post Note  Patient: Lance Parsons  Procedure(s) Performed: COLONOSCOPY WITH PROPOFOL  Patient location during evaluation: Endoscopy Anesthesia Type: General Level of consciousness: awake and alert Pain management: pain level controlled Vital Signs Assessment: post-procedure vital signs reviewed and stable Respiratory status: spontaneous breathing, nonlabored ventilation, respiratory function stable and patient connected to nasal cannula oxygen Cardiovascular status: blood pressure returned to baseline and stable Postop Assessment: no apparent nausea or vomiting Anesthetic complications: no   No notable events documented.   Last Vitals:  Vitals:   12/04/21 0922 12/04/21 1000  BP: (!) 129/96 110/84  Pulse: 89 85  Resp: 18 (!) 21  Temp: 36.7 C 36.7 C  SpO2: 98% 96%    Last Pain:  Vitals:   12/04/21 1000  TempSrc: Tympanic  PainSc: Asleep                 Ilene Qua

## 2021-12-05 LAB — SURGICAL PATHOLOGY

## 2021-12-06 ENCOUNTER — Encounter: Payer: Self-pay | Admitting: Gastroenterology

## 2022-03-05 LAB — LAB REPORT - SCANNED: EGFR: 95

## 2022-07-12 IMAGING — DX DG CHEST 1V PORT
1 series · 1 of 1 positions shown · non-contrast
Comparison: None.

CLINICAL DATA: Short of breath for several weeks

EXAM:
PORTABLE CHEST 1 VIEW

[chest]
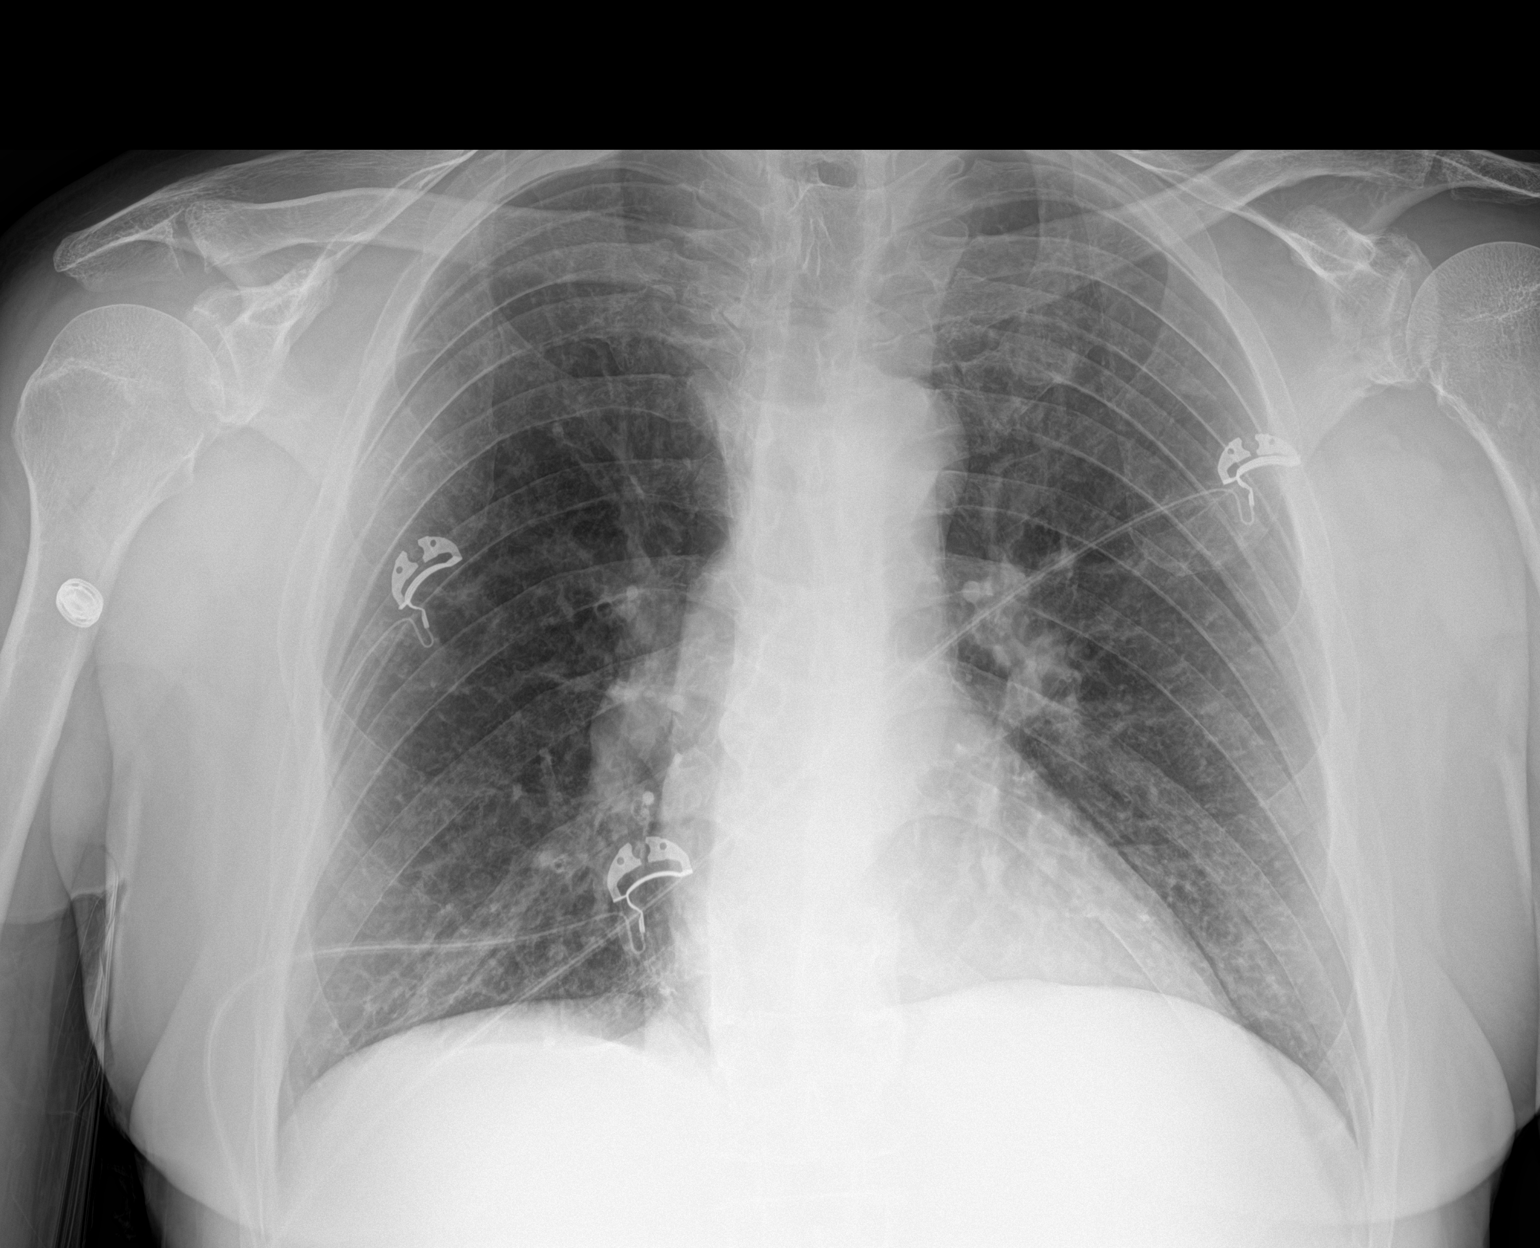

[1 of 1 positions shown; findings below may reference images not displayed]

FINDINGS: Normal mediastinum and cardiac silhouette. Chronic central
bronchitic markings. Normal pulmonary vasculature. No effusion,
infiltrate, or pneumothorax.
IMPRESSION: Chronic bronchitic markings. No acute findings.

## 2023-02-24 LAB — BASIC METABOLIC PANEL WITH GFR
BUN: 8 (ref 4–21)
CO2: 23 — AB (ref 13–22)
Chloride: 105 (ref 99–108)
Creatinine: 0.9 (ref 0.6–1.3)
Glucose: 83
Potassium: 4.1 meq/L (ref 3.5–5.1)
Sodium: 142 (ref 137–147)

## 2023-02-24 LAB — PSA: PSA: 1.1

## 2023-02-24 LAB — COMPREHENSIVE METABOLIC PANEL WITH GFR
Calcium: 9.4 (ref 8.7–10.7)
eGFR: 94

## 2023-02-24 LAB — CBC AND DIFFERENTIAL
HCT: 45 (ref 41–53)
Hemoglobin: 15.4 (ref 13.5–17.5)
Platelets: 262 K/uL (ref 150–400)
WBC: 6.1

## 2023-02-24 LAB — CBC
EGFR: 94
RBC: 4.65 (ref 3.87–5.11)

## 2023-02-24 LAB — LIPID PANEL
Cholesterol: 190 (ref 0–200)
HDL: 62 (ref 35–70)
LDL Cholesterol: 110
Triglycerides: 102 (ref 40–160)

## 2023-05-07 ENCOUNTER — Inpatient Hospital Stay
Admission: RE | Admit: 2023-05-07 | Discharge: 2023-05-07 | Disposition: A | Discharge status: Home / Self Care | Payer: Self-pay | Source: Ambulatory Visit | Attending: Orthopedic Surgery | Admitting: Orthopedic Surgery

## 2023-05-07 ENCOUNTER — Other Ambulatory Visit: Payer: Self-pay | Admitting: Family Medicine

## 2023-05-07 DIAGNOSIS — Z049 Encounter for examination and observation for unspecified reason: Secondary | ICD-10-CM

## 2023-05-12 ENCOUNTER — Other Ambulatory Visit: Payer: Self-pay | Admitting: Physician Assistant

## 2023-05-12 ENCOUNTER — Ambulatory Visit
Admission: RE | Admit: 2023-05-12 | Discharge: 2023-05-12 | Disposition: A | Discharge status: Home / Self Care | Attending: Physician Assistant | Admitting: Physician Assistant

## 2023-05-12 ENCOUNTER — Encounter: Payer: Self-pay | Admitting: Physician Assistant

## 2023-05-12 ENCOUNTER — Ambulatory Visit: Admitting: Physician Assistant

## 2023-05-12 VITALS — BP 136/98 | Ht 66.0 in | Wt 155.0 lb

## 2023-05-12 DIAGNOSIS — Z8589 Personal history of malignant neoplasm of other organs and systems: Secondary | ICD-10-CM

## 2023-05-12 DIAGNOSIS — G950 Syringomyelia and syringobulbia: Secondary | ICD-10-CM

## 2023-05-12 DIAGNOSIS — M4714 Other spondylosis with myelopathy, thoracic region: Secondary | ICD-10-CM

## 2023-05-12 DIAGNOSIS — G8929 Other chronic pain: Secondary | ICD-10-CM | POA: Diagnosis not present

## 2023-05-12 DIAGNOSIS — M542 Cervicalgia: Secondary | ICD-10-CM

## 2023-05-12 DIAGNOSIS — Z9889 Other specified postprocedural states: Secondary | ICD-10-CM

## 2023-05-12 DIAGNOSIS — R292 Abnormal reflex: Secondary | ICD-10-CM | POA: Diagnosis not present

## 2023-05-12 NOTE — Progress Notes (Signed)
 Referring Physician:  Jaclyn Shaggy, MD [id number]   [place],  [place] [address]  Primary Physician:  Jaclyn Shaggy, MD  History of Present Illness: 05/12/2023 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  He states it goes across his ribs and stops at midline.  He describes it as a knife stabbing him.  This is associated with numbness and tingling.  He has some baseline weakness in his left upper extremity that he also feels as though he has become worse.  He states that he has started dropping things in his left hand and his gait has become worse.  He also has back pain that extends down the back of his leg and into his calf.  He notices weakness in his leg primarily when climbing up and down the stairs.  He is concerned because he feels as though he cannot walk in a straight line and is also had 30 pounds of unintended weight loss over the past year.  No changes to his bowel or bladder.  He has not seen pain management for hydrocodone for quite some time which helps with his pain tremendously.    Conservative measures:  Physical therapy:  Has not participated in PT recently Multimodal medical therapy including regular antiinflammatories:  Hydrocodone, Gabapentin, Ibuprofen Injections: no epidural steroid injections  Past Surgery: 04/26/2013 Occult tumor Robotic surgery removal of tonsils, lymph nodes, Saliva glands all on the right side of neck 10/04/2015 C3-4 Laminectomy 11/16/2020 Head/Neck Lipoma Excision   Lance Parsons has  symptoms of cervical myelopathy.  The symptoms are causing a significant impact on the patient's life.   Review of Systems:  A 10 point review of systems is negative, except for the pertinent positives and negatives detailed in the  HPI.  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    Family history of brain cancer    Family history of breast cancer    Family history of kidney cancer    Family history of stomach cancer    History of colon polyps    Hypertension 2005   Personal history of colonic polyps     Past Surgical History: Past Surgical History:  Procedure Laterality Date   BACK SURGERY     CHOLECYSTECTOMY     COLONOSCOPY  2006   COLONOSCOPY WITH PROPOFOL N/A 09/13/2019   Procedure: COLONOSCOPY WITH PROPOFOL;  Surgeon: Wyline Mood, MD;  Location: [facility] ENDOSCOPY;  Service: Endoscopy;  Laterality: N/A;   COLONOSCOPY WITH PROPOFOL N/A 03/07/2020   Procedure: COLONOSCOPY WITH PROPOFOL;  Surgeon: Wyline Mood, MD;  Location: [facility] ENDOSCOPY;  Service: Gastroenterology;  Laterality: N/A;   COLONOSCOPY WITH PROPOFOL N/A 12/04/2021   Procedure: COLONOSCOPY WITH PROPOFOL;  Surgeon: Wyline Mood, MD;  Location: [facility] ENDOSCOPY;  Service: Gastroenterology;  Laterality: N/A;   KNEE SURGERY Left 2005   LAMINECTOMY     c spine   NECK SURGERY     squamous cell carcinoma removal    TONSILLECTOMY  04/26/2013   and adenoids    Allergies: Allergies as of 05/12/2023 - Review Complete 05/12/2023  Allergen Reaction Noted  Tramadol Nausea Only 05/18/2014   Codeine Swelling and Rash 09/10/2012    Medications: Outpatient Encounter Medications as of 05/12/2023  Medication Sig   albuterol (VENTOLIN HFA) 108 (90 Base) MCG/ACT inhaler Inhale 2 puffs into the lungs every 6 (six) hours as needed for wheezing or shortness of breath. (Patient taking differently: Inhale 2 puffs into the lungs every 4 (four) hours as needed for wheezing or shortness of breath.)   ALPRAZolam (XANAX) 1 MG tablet Take 0.5 mg by mouth 3 (three) times daily as needed for anxiety.   Ascorbic Acid  (VITAMIN C PO) Take 1 tablet by mouth daily.   HYDROcodone-acetaminophen (NORCO/VICODIN) 5-325 MG tablet Take 1 tablet by mouth 3 (three) times daily.   [DISCONTINUED] gabapentin (NEURONTIN) 600 MG tablet Take 600 mg by mouth 3 (three) times daily.   [DISCONTINUED] lisinopril (PRINIVIL,ZESTRIL) 30 MG tablet Take 30 mg by mouth 3 (three) times daily.   [DISCONTINUED] Multiple Vitamins-Minerals (ZINC PO) Take 1 tablet by mouth daily.   [DISCONTINUED] albuterol (PROVENTIL) (2.5 MG/3ML) 0.083% nebulizer solution Take 3 mLs (2.5 mg total) by nebulization every 6 (six) hours as needed for wheezing or shortness of breath. (Patient taking differently: Take 2.5 mg by nebulization every 4 (four) hours as needed for wheezing or shortness of breath.)   [DISCONTINUED] amLODipine (NORVASC) 2.5 MG tablet Take 2.5 mg by mouth every morning. (Patient not taking: Reported on 05/12/2023)   [DISCONTINUED] ibuprofen (ADVIL) 200 MG tablet Take 800 mg by mouth every 6 (six) hours as needed for fever.   [DISCONTINUED] zolpidem (AMBIEN) 5 MG tablet Take 1 tablet (5 mg total) by mouth at bedtime as needed for sleep. (Patient not taking: No sig reported)   No facility-administered encounter medications on file as of 05/12/2023.    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 Attention to examination is appropriate.  Psychiatric: Patient is non-anxious.  Head:  Pupils equal, round, and reactive to light.  ENT:  Oral mucosa appears well hydrated.  Neck:   Supple.  Slight decreased range of motion.  Respiratory: Patient is breathing without any difficulty.  Extremities: No edema.  Vascular: Palpable dorsal pedal pulses.  Skin:   On exposed skin, there are no abnormal skin lesions.  NEUROLOGICAL:     Awake, alert, oriented to person, place, and time.  Speech is clear and fluent. Fund of knowledge  is appropriate.   Cranial Nerves: Pupils equal round and reactive to light.  Facial tone is symmetric.   Patient has tenderness to his right cervical paraspinals as well as his thoracic spine and left-sided thoracic paraspinals.  Strength: Side Biceps Triceps Deltoid Interossei Grip Wrist Ext. Wrist Flex.  R 5 5 5 5 5 5 5   L 5 5 5  4- 4 5 5    Side Iliopsoas Quads Hamstring PF DF EHL  R 5 5 5 5 5 5   L 5 5 5 5 5 5    Reflexes are 3+ in biceps, brachioradialis, triceps.  Hoffmann's is present.  3+ in patella and Achilles.  Positive cross adductor sign. Clonus is not present.   Bilateral upper and lower extremity sensation is intact to light touch.    Patient with difficulty with tandem gait.  Medical Decision Making  Imaging:  MRI cervical spine (10/2021)  IMPRESSION:   1. Of note, vertebral numbering is similar to the most recent prior exam. There is assimilation at the craniocervical junction and nonsegmentation at C2-C3 and C6-C7 with ribs noted at T1 which should be taken into account if there is consideration for surgical intervention.  2.  No significant change in comparison to the most recent prior MRI of the cervical spine from 04/26/2021.  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 There is also similar canal stenosis at C5-C6.  3.  No significant change in appearance of the spinal cord, which is described above. 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     Thoracic X-ray 11/04/22:   1.  No acute fracture or traumatic malalignment. 2.  Similar multilevel segmentation anomalies in the cervical and upper thoracic spine, better assessed on prior cross-sectional imaging. 3.  Mild thoracic spondylosis. 4.  Chronic mild vertebral body wedging and kyphosis in the lower thoracic spine.     I have personally reviewed the images and agree with the above interpretation.    Assessment and Plan: Mr. Ke is a pleasant 68 y.o. 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  He states it goes across his ribs and stops at midline.  He describes it as a knife stabbing him.  This is associated with numbness and tingling.  He has some baseline weakness in his left upper extremity that he also feels as though he has become worse.  He states that he has started dropping things in his left hand and his gait has become worse.  He also has back pain that extends down the back of his leg and into his calf.  He notices weakness in his leg primarily when climbing up and down the stairs.  He is concerned because he feels as though he cannot walk in a straight line and is also had 30 pounds of unintended weight loss over the past year.  No changes to his bowel or bladder.  He has not seen pain  management for hydrocodone for quite some time which helps with his pain tremendously.  On examination he has some weakness in his left hand.  He is  floridly hyperreflexic in his upper and lower extremities.  Positive Hoffmann's.  Positive cross adductor sign.  Previous imaging from 2023 was reviewed.  Is a pleasure to see patient in clinic today.  We will obtain new x-rays while in clinic today.  He already has a physical therapy referral and I advised him to go ahead and make an appointment.  I am concerned due to his myelopathy and hyperreflexia in conjunction with his known syrinx and oncologic history.  Plan to get MRI of his cervical and thoracic spine with and without contrast for further review.  Once completed we will review results.  Patient encouraged reach out to me for questions or concerns prior to this.  Thank you for involving me in the care of this patient.   Joan Flores, PA-C Dept. of Neurosurgery

## 2023-05-12 NOTE — Addendum Note (Signed)
 Addended by: Joan Flores on: 05/12/2023 02:27 PM   Modules accepted: Orders

## 2023-05-13 ENCOUNTER — Ambulatory Visit
Admission: RE | Admit: 2023-05-13 | Discharge: 2023-05-13 | Disposition: A | Discharge status: Home / Self Care | Source: Ambulatory Visit | Attending: Physician Assistant | Admitting: Physician Assistant

## 2023-05-13 ENCOUNTER — Other Ambulatory Visit: Payer: Self-pay | Admitting: Physician Assistant

## 2023-05-13 DIAGNOSIS — G8929 Other chronic pain: Secondary | ICD-10-CM

## 2023-05-13 NOTE — Addendum Note (Signed)
 Addended by: Ernie Hew on: 05/13/2023 09:42 AM   Modules accepted: Orders

## 2023-06-22 ENCOUNTER — Ambulatory Visit
Admission: RE | Admit: 2023-06-22 | Discharge: 2023-06-22 | Disposition: A | Discharge status: Home / Self Care | Source: Ambulatory Visit | Attending: Physician Assistant | Admitting: Physician Assistant

## 2023-06-22 DIAGNOSIS — M542 Cervicalgia: Secondary | ICD-10-CM

## 2023-06-22 DIAGNOSIS — G950 Syringomyelia and syringobulbia: Secondary | ICD-10-CM

## 2023-06-22 DIAGNOSIS — G8929 Other chronic pain: Secondary | ICD-10-CM

## 2023-06-22 DIAGNOSIS — Z9889 Other specified postprocedural states: Secondary | ICD-10-CM

## 2023-06-22 DIAGNOSIS — R292 Abnormal reflex: Secondary | ICD-10-CM

## 2023-06-22 MED ORDER — GADOPICLENOL 0.5 MMOL/ML IV SOLN
7.0000 mL | Freq: Once | INTRAVENOUS | Status: AC | PRN
  Administered 2023-06-22: 7 mL via INTRAVENOUS

## 2023-07-16 ENCOUNTER — Ambulatory Visit: Payer: Self-pay | Admitting: Physician Assistant

## 2023-07-25 NOTE — Progress Notes (Signed)
 Referring Physician:  Westley Hammers, MD [address]   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  He states it goes across his ribs and stops at midline.  He describes it as a knife stabbing him.  This is associated with numbness and tingling.  He has some baseline weakness in his left upper extremity that he also feels as though he has become worse.  He states that he has started dropping things in his left hand and his gait has become worse.  He also has back pain that extends down the back of his leg and into his calf.  He notices weakness in his leg primarily when climbing up and down the stairs.  He is concerned because he feels as though he cannot walk in a straight line and is also had 30 pounds of unintended weight loss over the past year.  No changes to his bowel or bladder.  He continues to follow with his oncology team.  In regards to his symptomatology he states that is mostly left-sided in regards to weakness and coordination issues.  Also has radiating pain down the side as well.    Conservative measures:  Physical therapy:  Has not participated in PT recently Multimodal medical therapy including regular antiinflammatories:  Hydrocodone, Gabapentin, Ibuprofen Injections: no epidural steroid injections  Past Surgery: 04/26/2013 Occult tumor Robotic surgery removal of tonsils, lymph nodes, Saliva glands all on the right side of neck 10/04/2015 C3-4 Laminectomy 11/16/2020 Head/Neck Lipoma Excision   Lance Parsons has  symptoms of cervical myelopathy.  The symptoms are causing a significant impact on the patient's life.   Review of Systems:  A 10 point  review of systems is negative, except for the pertinent positives and negatives detailed in the HPI.  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    Family history of brain cancer    Family history of breast cancer    Family history of kidney cancer    Family history of stomach cancer    History of colon polyps    Hypertension 2005   Personal history of colonic polyps     Past Surgical History: Past Surgical History:  Procedure Laterality Date   BACK SURGERY     CHOLECYSTECTOMY     COLONOSCOPY  2006   COLONOSCOPY WITH PROPOFOL  N/A 09/13/2019   Procedure: COLONOSCOPY WITH PROPOFOL ;  Surgeon: Luke Salaam, MD;  Location: [facility] ENDOSCOPY;  Service: Endoscopy;  Laterality: N/A;   COLONOSCOPY WITH PROPOFOL  N/A 03/07/2020   Procedure: COLONOSCOPY WITH PROPOFOL ;  Surgeon: Luke Salaam, MD;  Location[facility] ENDOSCOPY;  Service: Gastroenterology;  Laterality: N/A;   COLONOSCOPY WITH PROPOFOL  N/A 12/04/2021   Procedure: COLONOSCOPY WITH PROPOFOL ;  Surgeon: Luke Salaam, MD;  Location: [facility] ENDOSCOPY;  Service: Gastroenterology;  Laterality: N/A;   KNEE SURGERY Left 2005   LAMINECTOMY     c spine   NECK SURGERY     squamous cell carcinoma removal    TONSILLECTOMY  04/26/2013  and adenoids    Allergies: Allergies as of 07/28/2023 - Review Complete 07/28/2023  Allergen Reaction Noted   Tramadol Nausea Only 05/18/2014   Codeine Swelling and Rash 09/10/2012    Medications: Outpatient Encounter Medications as of 07/28/2023  Medication Sig   albuterol  (VENTOLIN  HFA) 108 (90 Base) MCG/ACT inhaler Inhale 2 puffs into the lungs every 6 (six) hours as needed for wheezing or shortness of breath. (Patient taking differently: Inhale 2 puffs into the lungs every 4 (four) hours as needed for wheezing or shortness of breath.)   ALPRAZolam (XANAX) 1 MG  tablet Take 0.5 mg by mouth 3 (three) times daily as needed for anxiety.   Ascorbic Acid (VITAMIN C PO) Take 1 tablet by mouth daily.   HYDROcodone-acetaminophen (NORCO/VICODIN) 5-325 MG tablet Take 1 tablet by mouth 3 (three) times daily.   [DISCONTINUED] zolpidem  (AMBIEN ) 5 MG tablet Take 1 tablet (5 mg total) by mouth at bedtime as needed for sleep. (Patient not taking: No sig reported)   No facility-administered encounter medications on file as of 07/28/2023.    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 Attention to examination is appropriate.   NEUROLOGICAL:     Awake, alert, oriented to person, place, and time.  Speech is clear and fluent. Fund of knowledge is appropriate.   Strength: Side Biceps Triceps Deltoid Interossei Grip Wrist Ext. Wrist Flex.  R 5 5 5 5 5 5 5   L 5 5 5  4- 4 5 5    Side Iliopsoas Quads Hamstring PF DF EHL  R 5 5 5 5 5 5   L 5 5 5 5 5 5    Reflexes are 3+ in biceps, brachioradialis, triceps, and pectoralis on the left.  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  3+ in patella and Achilles on the left.   Positive cross adductor sign on the left.  He has decreased proprioception on the left.  Difficult to ascertain his pain sensation bilaterally.  Feels that this is mostly symmetric.  Medical Decision Making  Imaging:  Narrative & Impression  CLINICAL DATA:  Neck pain that radiates to left side of body, prior laminectomy   EXAM: MRI CERVICAL SPINE WITHOUT AND WITH CONTRAST; MRI of the thoracic spine without and with intravenous contrast   TECHNIQUE: Multiplanar and multiecho pulse sequences of the cervical spine, to include the craniocervical junction and cervicothoracic  junction, were obtained without and with intravenous contrast.   CONTRAST:  7 mL of vueway  IV   COMPARISON:  MRI of the cervical spine dated 11/08/2021; MRI of the thoracic spine dated 11/18/2018   FINDINGS: Cervical spine:   Alignment: Physiologic.   Vertebrae: Prior laminectomy at C3-C4. Degenerative endplate marrow changes at a few levels. No acute fracture is identified.   Cord: T2 hyperintensity and volume loss in the cord at the levels of C2 and C3, compatible with myelomalacia as result of prior stenosis. 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   Posterior Fossa, vertebral arteries, paraspinal tissues: Prominent pannus at the level of the odontoid process. This results in at mild-to-moderate canal stenoses at the craniocervical junction. Partially visualized right thyroid nodule.   Disc levels:   C2-C3: Disc osteophyte complex. Severe left and moderate right facet arthropathy. Mild left uncovertebral joint disease. Mild left neuroforaminal stenosis. No spinal canal stenosis.   C3-C4: Disc osteophyte complex. Mild bilateral facet arthropathy. Severe left and mild right uncovertebral joint disease. Severe left and mild right neuroforaminal stenosis. No spinal canal stenosis.   C4-C5: Disc osteophyte complex. Moderate bilateral  facet arthropathy. Severe bilateral uncovertebral joint disease. Severe bilateral neuroforaminal stenosis. Moderate spinal canal stenosis.   C5-C6: Partial congenital fusion. Severe right facet arthropathy. Moderate right uncovertebral joint disease. Moderate right neuroforaminal stenosis. No spinal canal stenosis.   C6-C7: Disc osteophyte complex. Severe left facet arthropathy. Severe left uncovertebral joint disease. Severe left neuroforaminal stenosis. No spinal canal stenosis.   C7-T1: The disk is normal in configuration. No facet arthropathy. No uncovertebral joint disease. No neuroforaminal stenosis. No spinal canal stenosis.   Thoracic spine:   Alignment:  Physiologic.   Vertebrae: No fracture, evidence of discitis, or bone lesion. Heterogeneity of the bone marrow. Schmorl's nodes at several levels. Degenerative endplate marrow changes at a few levels.   Cord:  T2 hyperintensity in the cord at the level of C7-T1 as above.   Paraspinal and other soft tissues: Negative.   Disc levels:   Mild canal stenoses at T1-T2 secondary to disc bulging and facet arthropathy. No high-grade foraminal stenosis.   IMPRESSION: 1. Prior laminectomy at C3-C4. 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 No abnormal enhancement in this region. 2. Moderate canal stenosis at C4-C5. 3. Severe foraminal stenoses on the left at C3-C4, bilaterally at C4-C5, and on the left at C6-C7. 4. Mild canal stenoses at T1-T2 secondary to disc bulging and facet arthropathy.     Electronically Signed   By: Johnanna Mylar M.D.   On: 07/15/2023 16:32     I have personally reviewed the images and agree with the above interpretation.    Assessment and Plan: Lance Parsons is a pleasant 68 y.o. male has a past medical history of radical dissection of the right side of his neck due to occult tumor found to be  squamous cell and previous C3-4 laminectomy who comes today with worsening severe mid back pain radiating to his left side.  He states it goes across his ribs and stops at midline.  He describes it as a knife stabbing him.  This is associated with numbness and tingling.  He has some baseline weakness in his left upper extremity that he also feels as though he has become worse.  He states  that he has started dropping things in his left hand and his gait has become worse.  He also has back pain that extends down the back of his leg and into his calf.  He notices weakness in his leg primarily when climbing up and down the stairs.  He has been following with his oncologist and has not had any progression of his previous cancer diagnosis.  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  His MRI does show some slight worsening of his thoracic level syrinx, however this has been present for many years.  I would like to review his case with Dr. Mont Antis given the complex nature of his spinal history.  Clinically appears to have cervical myelopathy and this localizes pretty well to his left-sided cervical predominant myelomalacia at the upper levels, however he does seem to have a significant upper thoracic syrinx with T2 signal change associated with this as well.  He is hyperreflexic above this lesion, however it is unclear whether or not the changes in the syrinx may be contributing to some of his lower extremity/gait worsening.  Given the T1 region could also be associated with some of his hand changes as well.  I have also made a referral to primary care provider as his primary care provider is about to retire.    Thank you for involving me in the care of  this patient.   Carroll Clamp, MD Dept. of Neurosurgery

## 2023-07-28 ENCOUNTER — Ambulatory Visit: Admitting: Neurosurgery

## 2023-07-28 ENCOUNTER — Encounter: Payer: Self-pay | Admitting: Neurosurgery

## 2023-07-28 VITALS — BP 134/88 | Ht 66.0 in | Wt 161.0 lb

## 2023-07-28 DIAGNOSIS — M542 Cervicalgia: Secondary | ICD-10-CM

## 2023-08-05 ENCOUNTER — Other Ambulatory Visit: Payer: Self-pay | Admitting: Physician Assistant

## 2023-08-05 DIAGNOSIS — R29898 Other symptoms and signs involving the musculoskeletal system: Secondary | ICD-10-CM

## 2023-08-05 DIAGNOSIS — M5417 Radiculopathy, lumbosacral region: Secondary | ICD-10-CM

## 2023-08-05 DIAGNOSIS — M542 Cervicalgia: Secondary | ICD-10-CM

## 2023-08-14 ENCOUNTER — Encounter: Payer: Self-pay | Admitting: Neurosurgery

## 2023-08-15 ENCOUNTER — Ambulatory Visit: Admitting: Neurosurgery

## 2023-08-15 DIAGNOSIS — G950 Syringomyelia and syringobulbia: Secondary | ICD-10-CM | POA: Insufficient documentation

## 2023-08-15 DIAGNOSIS — M542 Cervicalgia: Secondary | ICD-10-CM

## 2023-08-15 DIAGNOSIS — R29898 Other symptoms and signs involving the musculoskeletal system: Secondary | ICD-10-CM

## 2023-08-15 DIAGNOSIS — Z9889 Other specified postprocedural states: Secondary | ICD-10-CM | POA: Insufficient documentation

## 2023-08-15 DIAGNOSIS — G959 Disease of spinal cord, unspecified: Secondary | ICD-10-CM | POA: Insufficient documentation

## 2023-08-15 NOTE — Progress Notes (Signed)
 I had a follow-up phone visit today with Lance Parsons.  He was at home and I was in the office.  He gave consent to go forward with a phone visit.  We discussed his symptomatology, he continues to have progressive cervical spondylosis and cervical stenosis, his most significant areas are at the C1-2 junction secondary to an inflammatory pannus, and lower down in his cervical spine secondary to degenerative disc disease.  He has had previous cervical surgery and cervical radiation due to squamous cell c [place].  He continues to have progressive myeloradiculopathy.  I like to get a repeat CT of the cervical spine to evaluate the C1-2 junction, would also like to see his bone quality.  We discussed that due to the inflammatory pannus and ventral compression that this would ideally be treated with stabilization at the C1-2 joint, including a C1-2 fusion.  If this does not resolve would potentially need an anterior decompression, however given his history this is preferable to try and treat from the posterior approach.  In regards to the lower area of stenosis secondary to degenerative joint disease, I would plan on doing laminoplasty across this level.  We be able to continue to follow his syrinx to evaluate for any expansion.  At this point given his symptoms are mostly upper extremity and that his syrinx is below these levels we will plan on treating the areas of cervical myelopathy and cervical stenosis first.  He was agreeable to this plan.  We discussed this with our partners and had a spine consultation.  Once his CT scan is done we will plan on equalization and decompression for his progressive cervical myelopathy.

## 2023-08-17 ENCOUNTER — Encounter: Payer: Self-pay | Admitting: Neurosurgery

## 2023-08-21 ENCOUNTER — Other Ambulatory Visit: Payer: Self-pay | Admitting: Neurosurgery

## 2023-08-21 NOTE — Addendum Note (Signed)
 Addended by: Dannelle Rhymes W on: 08/21/2023 04:26 PM   Modules accepted: Orders

## 2023-09-26 ENCOUNTER — Telehealth: Payer: Self-pay | Admitting: Internal Medicine

## 2023-09-26 NOTE — Telephone Encounter (Signed)
 Copied from CRM [id number]. Topic: Appointments - Scheduling Inquiry for Clinic >> Sep 26, 2023  3:28 PM Rosaria BRAVO wrote: Reason for CRM: Pt's wife wants to know if Dr. Gasper will see this patient because his wife sees Dr. Gasper. Her name is Mordecai Tindol. Says he needs a PCP immediately.

## 2023-09-26 NOTE — Telephone Encounter (Deleted)
 Opened in error

## 2023-10-06 ENCOUNTER — Ambulatory Visit: Payer: Self-pay

## 2023-11-05 ENCOUNTER — Ambulatory Visit: Payer: Self-pay | Admitting: Family Medicine

## 2023-11-05 ENCOUNTER — Encounter: Payer: Self-pay | Admitting: Family Medicine

## 2023-11-05 VITALS — BP 129/82 | HR 73 | Ht 66.0 in | Wt 165.6 lb

## 2023-11-05 DIAGNOSIS — M5417 Radiculopathy, lumbosacral region: Secondary | ICD-10-CM

## 2023-11-05 DIAGNOSIS — F419 Anxiety disorder, unspecified: Secondary | ICD-10-CM | POA: Diagnosis not present

## 2023-11-05 DIAGNOSIS — J4520 Mild intermittent asthma, uncomplicated: Secondary | ICD-10-CM | POA: Diagnosis not present

## 2023-11-05 DIAGNOSIS — Z860101 Personal history of adenomatous and serrated colon polyps: Secondary | ICD-10-CM

## 2023-11-05 DIAGNOSIS — Z9889 Other specified postprocedural states: Secondary | ICD-10-CM

## 2023-11-05 DIAGNOSIS — M542 Cervicalgia: Secondary | ICD-10-CM

## 2023-11-06 ENCOUNTER — Ambulatory Visit: Payer: Self-pay | Admitting: Family Medicine

## 2023-12-01 NOTE — Progress Notes (Signed)
 Established patient visit   Patient: Lance Parsons   DOB: Dec 02, 1955   68 y.o. Male  MRN: [id number] Visit Date: 11/05/2023  Today's healthcare provider: Nancyann Perry, MD   Chief Complaint  Patient presents with   New Patient (Initial Visit)    Patient is present to establish care as last PCP is no longer in practice   Subjective    Discussed the use of AI scribe software for clinical note transcription with the patient, who gave verbal consent to proceed.  History of Present Illness   Lance Parsons is a 68 year old male who presents to establish care.  He has a history of squamous cell carcinoma with an occult tumor in the neck, for which he underwent extensive surgery including robotic surgery to remove lymph nodes and tonsils. Post-surgery, no cancer was detected, and he continues to follow up with his oncologist, with the most recent visit two weeks ago.  He has had recent surgeries for lipoma removal, one of which was the size of a golf ball to softball, and both were sent for testing. He also underwent a laminectomy in 2017 due to spinal cord compression causing nerve damage on the left side of his body. Recent MRIs and CT scans were performed due to increased back pain, and he sought a second opinion regarding potential neck fusion surgery.  He experiences chronic pain and has been on hydrocodone since 2017, but has not had it since June due to prescription issues following his previous doctor's retirement. He currently manages pain with acetaminophen and ibuprofen. He has been on alprazolam for anxiety for approximately 20 years, initially prescribed by his previous doctor, and has successfully reduced his dosage from 90 to 30 tablets per month. He takes one tablet at night, occasionally an extra half tablet when stressed.  He has a history of asthma, which has improved with age, requiring albuterol  treatments less frequently. He has not used his nebulizer in the  past year. He has not had a regular physical in the past year but has had blood work done, most recently in September, as part of routine checks.  He has not received a flu shot in the past two years and is hesitant about vaccines due to recent developments.       Medications: Outpatient Medications Prior to Visit  Medication Sig   albuterol  (VENTOLIN  HFA) 108 (90 Base) MCG/ACT inhaler Inhale 2 puffs into the lungs every 6 (six) hours as needed for wheezing or shortness of breath.   ALPRAZolam (XANAX) 1 MG tablet Take 0.5 mg by mouth 3 (three) times daily as needed for anxiety. (Patient taking differently: 1 to  1 1/2 tablets at bedtime as needed)   Ascorbic Acid (VITAMIN C PO) Take 1 tablet by mouth daily. (Patient not taking: Reported on 11/05/2023)   HYDROcodone-acetaminophen (NORCO/VICODIN) 5-325 MG tablet Take 1 tablet by mouth 3 (three) times daily. (Patient not taking: Reported on 11/05/2023)   No facility-administered medications prior to visit.   Review of Systems  Constitutional:  Negative for appetite change, chills and fever.  Respiratory:  Negative for chest tightness, shortness of breath and wheezing.   Cardiovascular:  Negative for chest pain and palpitations.  Gastrointestinal:  Negative for abdominal pain, nausea and vomiting.       Objective    BP 129/82 (BP Location: Left Arm, Patient Position: Sitting, Cuff Size: Normal)   Pulse 73   Ht 5' 6 (1.676 m)  Wt 165 lb 9.6 oz (75.1 kg)   SpO2 99%   BMI 26.73 kg/m   Physical Exam   General: Appearance:    Well developed, well nourished male in no acute distress  Eyes:    PERRL, conjunctiva/corneas clear, EOM's intact       Lungs:     Clear to auscultation bilaterally, respirations unlabored  Heart:    Normal heart rate. Normal rhythm. No murmurs, rubs, or gallops.    MS:   All extremities are intact.    Neurologic:   Awake, alert, oriented x 3. No apparent focal neurological defect.         Assessment & Plan        Chronic neck pain with history of cervical laminectomy Chronic neck pain persists post-cervical laminectomy. No further surgery needed, no cancer detected.  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 Fusion surgery not recommended. Pain management challenging post-hydrocodone.  Anxiety disorder with long-term benzodiazepine use Anxiety disorder managed with alprazolam. Dosage reduced, significant anxiety persists. Previous switch to Ambien  unsuccessful.  Asthma, mild intermittent Asthma significantly improved, rare albuterol  use.  History of squamous cell carcinoma of the neck, status post resection Status post resection for squamous cell carcinoma, no recurrence.  History of lipoma, status post excision Recent excision of lipoma, no concerning findings.         Nancyann Perry, MD  [facility] [phone] (phone) [phone] (fax)  [facility]

## 2023-12-08 NOTE — Patient Instructions (Signed)
 SABRA  Please review the attached list of medications and notify my office if there are any errors.   . Please bring all of your medications to every appointment so we can make sure that our medication list is the same as yours.

## 2023-12-16 ENCOUNTER — Encounter: Payer: Self-pay | Admitting: Internal Medicine

## 2024-01-12 ENCOUNTER — Encounter: Payer: Self-pay | Admitting: Family Medicine

## 2024-01-13 ENCOUNTER — Other Ambulatory Visit: Payer: Self-pay

## 2024-01-13 DIAGNOSIS — F419 Anxiety disorder, unspecified: Secondary | ICD-10-CM

## 2024-01-14 MED ORDER — ALPRAZOLAM 1 MG PO TABS: 0.5000 mg | ORAL_TABLET | Freq: Two times a day (BID) | ORAL | 3 refills | Status: AC | PRN
